# Patient Record
Sex: Male | Born: 1975 | Race: White | Hispanic: No | Marital: Married | Smoking: Current every day smoker
Health system: Southern US, Community
[De-identification: ages and names within clinical notes are randomized; demographics above are authoritative.]

## PROBLEM LIST (undated history)

## (undated) DIAGNOSIS — F329 Major depressive disorder, single episode, unspecified: Secondary | ICD-10-CM

## (undated) DIAGNOSIS — I219 Acute myocardial infarction, unspecified: Secondary | ICD-10-CM

## (undated) DIAGNOSIS — F319 Bipolar disorder, unspecified: Secondary | ICD-10-CM

## (undated) DIAGNOSIS — I10 Essential (primary) hypertension: Secondary | ICD-10-CM

## (undated) DIAGNOSIS — G43909 Migraine, unspecified, not intractable, without status migrainosus: Secondary | ICD-10-CM

## (undated) DIAGNOSIS — E785 Hyperlipidemia, unspecified: Secondary | ICD-10-CM

## (undated) DIAGNOSIS — F603 Borderline personality disorder: Secondary | ICD-10-CM

## (undated) DIAGNOSIS — R569 Unspecified convulsions: Secondary | ICD-10-CM

## (undated) DIAGNOSIS — F32A Depression, unspecified: Secondary | ICD-10-CM

## (undated) DIAGNOSIS — I2510 Atherosclerotic heart disease of native coronary artery without angina pectoris: Secondary | ICD-10-CM

## (undated) DIAGNOSIS — F419 Anxiety disorder, unspecified: Secondary | ICD-10-CM

## (undated) DIAGNOSIS — J45909 Unspecified asthma, uncomplicated: Secondary | ICD-10-CM

## (undated) HISTORY — DX: Migraine, unspecified, not intractable, without status migrainosus: G43.909

## (undated) HISTORY — DX: Acute myocardial infarction, unspecified: I21.9

## (undated) HISTORY — DX: Atherosclerotic heart disease of native coronary artery without angina pectoris: I25.10

## (undated) HISTORY — DX: Depression, unspecified: F32.A

## (undated) HISTORY — DX: Borderline personality disorder: F60.3

## (undated) HISTORY — DX: Major depressive disorder, single episode, unspecified: F32.9

## (undated) HISTORY — DX: Anxiety disorder, unspecified: F41.9

## (undated) HISTORY — DX: Essential (primary) hypertension: I10

## (undated) HISTORY — DX: Hyperlipidemia, unspecified: E78.5

## (undated) HISTORY — PX: CORONARY ANGIOPLASTY WITH STENT PLACEMENT: SHX49

## (undated) HISTORY — DX: Bipolar disorder, unspecified: F31.9

## (undated) HISTORY — DX: Unspecified asthma, uncomplicated: J45.909

---

## 2015-09-24 DIAGNOSIS — G2581 Restless legs syndrome: Secondary | ICD-10-CM | POA: Insufficient documentation

## 2016-01-31 DIAGNOSIS — F17200 Nicotine dependence, unspecified, uncomplicated: Secondary | ICD-10-CM | POA: Insufficient documentation

## 2016-01-31 DIAGNOSIS — M5136 Other intervertebral disc degeneration, lumbar region: Secondary | ICD-10-CM | POA: Insufficient documentation

## 2016-02-08 DIAGNOSIS — M5417 Radiculopathy, lumbosacral region: Secondary | ICD-10-CM | POA: Insufficient documentation

## 2016-02-08 DIAGNOSIS — G894 Chronic pain syndrome: Secondary | ICD-10-CM | POA: Insufficient documentation

## 2016-02-23 DIAGNOSIS — I2510 Atherosclerotic heart disease of native coronary artery without angina pectoris: Secondary | ICD-10-CM | POA: Insufficient documentation

## 2016-02-24 DIAGNOSIS — Z955 Presence of coronary angioplasty implant and graft: Secondary | ICD-10-CM | POA: Insufficient documentation

## 2016-07-01 DIAGNOSIS — R569 Unspecified convulsions: Secondary | ICD-10-CM

## 2016-07-01 HISTORY — DX: Unspecified convulsions: R56.9

## 2017-01-14 DIAGNOSIS — Z2821 Immunization not carried out because of patient refusal: Secondary | ICD-10-CM | POA: Insufficient documentation

## 2017-06-04 DIAGNOSIS — R451 Restlessness and agitation: Secondary | ICD-10-CM | POA: Insufficient documentation

## 2017-06-05 DIAGNOSIS — F447 Conversion disorder with mixed symptom presentation: Secondary | ICD-10-CM | POA: Insufficient documentation

## 2017-06-06 ENCOUNTER — Other Ambulatory Visit: Payer: Self-pay

## 2017-06-06 ENCOUNTER — Encounter: Payer: Self-pay | Admitting: Emergency Medicine

## 2017-06-06 ENCOUNTER — Emergency Department
Admission: EM | Admit: 2017-06-06 | Discharge: 2017-06-06 | Disposition: A | Discharge status: Home / Self Care | Payer: Self-pay | Attending: Emergency Medicine | Admitting: Emergency Medicine

## 2017-06-06 ENCOUNTER — Emergency Department: Payer: Self-pay

## 2017-06-06 DIAGNOSIS — F17200 Nicotine dependence, unspecified, uncomplicated: Secondary | ICD-10-CM | POA: Insufficient documentation

## 2017-06-06 DIAGNOSIS — R569 Unspecified convulsions: Secondary | ICD-10-CM | POA: Insufficient documentation

## 2017-06-06 DIAGNOSIS — R4789 Other speech disturbances: Secondary | ICD-10-CM | POA: Insufficient documentation

## 2017-06-06 DIAGNOSIS — I2510 Atherosclerotic heart disease of native coronary artery without angina pectoris: Secondary | ICD-10-CM | POA: Insufficient documentation

## 2017-06-06 DIAGNOSIS — E119 Type 2 diabetes mellitus without complications: Secondary | ICD-10-CM | POA: Insufficient documentation

## 2017-06-06 LAB — BASIC METABOLIC PANEL
ANION GAP: 9 (ref 5–15)
BUN: 10 mg/dL (ref 6–20)
CO2: 22 mmol/L (ref 22–32)
Calcium: 9 mg/dL (ref 8.9–10.3)
Chloride: 110 mmol/L (ref 101–111)
Creatinine, Ser: 0.93 mg/dL (ref 0.61–1.24)
GFR calc Af Amer: >60 mL/min (ref >60)
GFR calc non Af Amer: >60 mL/min (ref >60)
GLUCOSE: 104 mg/dL — AB (ref 65–99)
POTASSIUM: 4.1 mmol/L (ref 3.5–5.1)
Sodium: 141 mmol/L (ref 135–145)

## 2017-06-06 LAB — CBC
HEMATOCRIT: 41.5 % (ref 40.0–52.0)
HEMOGLOBIN: 14.2 g/dL (ref 13.0–18.0)
MCH: 30.9 pg (ref 26.0–34.0)
MCHC: 34.3 g/dL (ref 32.0–36.0)
MCV: 90.2 fL (ref 80.0–100.0)
Platelets: 147 10*3/uL — ABNORMAL LOW (ref 150–440)
RBC: 4.6 MIL/uL (ref 4.40–5.90)
RDW: 12.5 % (ref 11.5–14.5)
WBC: 7.4 10*3/uL (ref 3.8–10.6)

## 2017-06-06 MED ORDER — ALBUTEROL SULFATE (2.5 MG/3ML) 0.083% IN NEBU
2.50 | INHALATION_SOLUTION | RESPIRATORY_TRACT | Status: DC
Start: ? — End: 2017-06-06

## 2017-06-06 MED ORDER — GABAPENTIN 300 MG PO CAPS
600.00 | ORAL_CAPSULE | ORAL | Status: DC
Start: 2017-06-06 — End: 2017-06-06

## 2017-06-06 MED ORDER — SODIUM CHLORIDE 0.9 % IV SOLN
INTRAVENOUS | Status: DC
Start: ? — End: 2017-06-06

## 2017-06-06 MED ORDER — IBUPROFEN 600 MG PO TABS
600.00 | ORAL_TABLET | ORAL | Status: DC
Start: ? — End: 2017-06-06

## 2017-06-06 MED ORDER — LORAZEPAM 2 MG/ML IJ SOLN
2.00 | INTRAMUSCULAR | Status: DC
Start: ? — End: 2017-06-06

## 2017-06-06 MED ORDER — GENERIC EXTERNAL MEDICATION
Status: DC
Start: ? — End: 2017-06-06

## 2017-06-06 MED ORDER — PANTOPRAZOLE SODIUM 20 MG PO TBEC
20.00 | DELAYED_RELEASE_TABLET | ORAL | Status: DC
Start: 2017-06-07 — End: 2017-06-06

## 2017-06-06 MED ORDER — AMLODIPINE BESYLATE 5 MG PO TABS
5.00 | ORAL_TABLET | ORAL | Status: DC
Start: 2017-06-07 — End: 2017-06-06

## 2017-06-06 MED ORDER — CLOPIDOGREL BISULFATE 75 MG PO TABS
75.00 | ORAL_TABLET | ORAL | Status: DC
Start: 2017-06-07 — End: 2017-06-06

## 2017-06-06 MED ORDER — METOPROLOL SUCCINATE ER 25 MG PO TB24
25.00 | ORAL_TABLET | ORAL | Status: DC
Start: 2017-06-07 — End: 2017-06-06

## 2017-06-06 MED ORDER — ASPIRIN 325 MG PO TABS
325.00 | ORAL_TABLET | ORAL | Status: DC
Start: 2017-06-07 — End: 2017-06-06

## 2017-06-06 MED ORDER — ATORVASTATIN CALCIUM 40 MG PO TABS
40.00 | ORAL_TABLET | ORAL | Status: DC
Start: 2017-06-07 — End: 2017-06-06

## 2017-06-06 NOTE — Discharge Instructions (Signed)
Return to the emergency department for any new or worrisome symptoms occluding change in your seizure-like activity, vomiting, diarrhea, fever, severe pain or other complaints.  Do not soak in the tub, climb ladders, or drive until cleared by neurology.  Follow closely with [facility] neurology.

## 2017-06-06 NOTE — ED Triage Notes (Addendum)
Patient presents to ED with difficulty speaking. Patient has very delayed responses. When patient does speak he has stuttering speech. Patient in hospital gown and hospital socks. Patient left AMA from [place]. Wife reports patient had two big seizures there but "they didn't believe it because it didn't show up on a machine. They didn't even clean him up afterwards". Patient is lethargic but will follow commands after repetitive cues.

## 2017-06-06 NOTE — ED Notes (Addendum)
Pt family member noted that they left [facility] AMA because they didn't like the care the hospital was giving the pt. Family states that he had two witnessed seizures yesterday. Family at the bedside. Pt has Hx of heart attack Aug 2017.

## 2017-06-06 NOTE — ED Notes (Signed)
First Nurse Note:  Patient presents to the ED after leaving [facility].  Per patient's daughter patient has been having 2 seizures per day for the past several days.  Family was uncomfortable with Brad Randolph because they state patient had a seizure yesterday evening and "turned purple and they didn't care or were doing anything."

## 2017-06-06 NOTE — ED Notes (Signed)
Spoke with Dr. Joni Fears in regards to patient presentation. See orders.

## 2017-06-06 NOTE — ED Provider Notes (Addendum)
[facility] Emergency Department Provider Note  ____________________________________________   I have reviewed the triage vital signs and the nursing notes. Where available I have reviewed prior notes and, if possible and indicated, outside hospital notes.    HISTORY  Chief Complaint Seizures    HPI Brad Randolph is a 41 y.o. male who presents today complaining of 2-3 months worth of slow speech and occasional twitching behavior and anxiety and stress, patient has had extensive workup for this including 2- MRIs, and a negative EEG 2 days ago.  He was told that this is likely psychogenic and he and family left AGAINST MEDICAL ADVICE from the facility at which they were.  They actually had an outpatient follow-up with neurology today, they have already seen different neurologist but have not been happy with the answers they have gotten, and they have a follow-up appointment with neurology at [facility] today but were unable to make it because they were admitted at outside hospital.  All of those records are available and I have reviewed them.  Patient states that because he missed his appointment he is unable to get to [facility] until the 21st and he is worried what he is going to do until then because his speech is slow and he has several twitching episodes a day.  According to the patient and family has been having twitching episodes only really while awake since early October without any intermission despite extensive outpatient and inpatient workups for the same.  They want to know if there is anything else the ER at [facility] can provide   Past Medical History:  Diagnosis Date  . Diabetes mellitus without complication ([place])     There are no active problems to display for this patient.   Past Surgical History:  Procedure Laterality Date  . CORONARY ANGIOPLASTY WITH STENT PLACEMENT      Prior to Admission medications   Not on File    Allergies Trazodone and  nefazodone  No family history on file.  Social History Social History   Tobacco Use  . Smoking status: Current Every Day Smoker  . Smokeless tobacco: Never Used  Substance Use Topics  . Alcohol use: No    Frequency: Never  . Drug use: No    Review of Systems Constitutional: No fever/chills Eyes: No visual changes. ENT: No sore throat. No stiff neck no neck pain Cardiovascular: Denies chest pain. Respiratory: Denies shortness of breath. Gastrointestinal:   no vomiting.  No diarrhea.  No constipation. Genitourinary: Negative for dysuria. Musculoskeletal: Negative lower extremity swelling Skin: Negative for rash. Neurological: History of present illness   ____________________________________________   PHYSICAL EXAM:  VITAL SIGNS: ED Triage Vitals  Enc Vitals Group     BP 06/06/17 1702 124/84     Pulse Rate 06/06/17 1702 96     Resp 06/06/17 1702 16     Temp 06/06/17 1702 98.4 F (36.9 C)     Temp Source 06/06/17 1702 Oral     SpO2 06/06/17 1702 99 %     Weight 06/06/17 1702 200 lb (90.7 kg)     Height 06/06/17 1702 5' 9"  (1.753 m)     Head Circumference --      Peak Flow --      Pain Score 06/06/17 1916 10     Pain Loc --      Pain Edu? --      Excl. in [place]? --     Constitutional: Alert and oriented. Well appearing  and in no acute distress.  Skin speaks in a primitive slow voice but is coherent Eyes: Conjunctivae are normal Head: Atraumatic HEENT: No congestion/rhinnorhea. Mucous membranes are moist.  Oropharynx non-erythematous Neck:   Nontender with no meningismus, no masses, no stridor Cardiovascular: Normal rate, regular rhythm. Grossly normal heart sounds.  Good peripheral circulation. Respiratory: Normal respiratory effort.  No retractions. Lungs CTAB. Abdominal: Soft and nontender. No distention. No guarding no rebound Back:  There is no focal tenderness or step off.  there is no midline tenderness there are no lesions noted. there is no CVA  tenderness Musculoskeletal: No lower extremity tenderness, no upper extremity tenderness. No joint effusions, no DVT signs strong distal pulses no edema Neurologic: Patient is normal in understanding and in ability to make words however, he has very slow deliberate speech with very simple grammatical primitive structures noted.  "I no know why" he has limited interaction with my neurologic exam but there is no obvious focal neurologic deficit.  When he holds both hands up there is no drift, no obvious weakness noted in the lower extremities but he gets easily distracted and has to be coached multiple times to participate in the exam.  He is able to use his cell phone with no difficulty including fine typing. Skin:  Skin is warm, dry and intact. No rash noted. Psychiatric: Mood and affect are normal. Speech and behavior are normal.  ____________________________________________   LABS (all labs ordered are listed, but only abnormal results are displayed)  Labs Reviewed  BASIC METABOLIC PANEL - Abnormal; Notable for the following components:      Result Value   Glucose, Bld 104 (*)    All other components within normal limits  CBC - Abnormal; Notable for the following components:   Platelets 147 (*)    All other components within normal limits  CBG MONITORING, ED    Pertinent labs  results that were available during my care of the patient were reviewed by me and considered in my medical decision making (see chart for details). ____________________________________________  EKG  I personally interpreted any EKGs ordered by me or triage  ____________________________________________  RADIOLOGY  Pertinent labs & imaging results that were available during my care of the patient were reviewed by me and considered in my medical decision making (see chart for details). If possible, patient and/or family made aware of any abnormal findings.  Ct Head Wo Contrast  Result Date:  06/06/2017 CLINICAL DATA:  Difficulty speaking. EXAM: CT HEAD WITHOUT CONTRAST TECHNIQUE: Contiguous axial images were obtained from the base of the skull through the vertex without intravenous contrast. COMPARISON:  None. FINDINGS: Brain: No evidence of acute infarction, hemorrhage, hydrocephalus, extra-axial collection or mass lesion/mass effect. Vascular: No hyperdense vessel or unexpected calcification. Skull: Normal. Negative for fracture or focal lesion. Sinuses/Orbits: Mild mucosal thickening in the bilateral maxillary sinuses and ethmoid air cells. The mastoid air cells are clear. The orbits are unremarkable. Other: None. IMPRESSION: 1.  No acute intracranial abnormality. 2. Mild paranasal sinus disease. Electronically Signed   By: Titus Dubin M.D.   On: 06/06/2017 17:47   ____________________________________________    PROCEDURES  Procedure(s) performed: None  Procedures  Critical Care performed: None  ____________________________________________   INITIAL IMPRESSION / ASSESSMENT AND PLAN / ED COURSE  Pertinent labs & imaging results that were available during my care of the patient were reviewed by me and considered in my medical decision making (see chart for details).  She has been having seizure-like  activity for several months has seen multiple neurologists had 2- MRIs negative EEG and negative blood work, CT scan here is negative, unclear exactly what the etiology of this patient's symptom is.  It is thought to be psychogenic by the prior hospital, patient has no SI or HI, there is certainly nothing we are going to offer at [facility] that is going to transcend what he is already had an workup during this exact same hospital admission which ended abruptly today when he was diagnosed with pseudo-seizure and psychogenic neurologic behavior.  Nonetheless, I did call [facility] neurology as patient is scheduled to follow-up with them on the 21st.  Dr. Arna Snipe was kind  enough to take the call I made him aware of findings and the patient's records, he had no further suggestions and he will continue to see the patient as an outpatient.  There was no suggestion of epileptic medication I think that is not unreasonable given negative MRI and very atypical chronic symptoms.  We have advised the patient not to drive, soak in the tub, climb ladders and return precautions and follow-up been given and understood.  ----------------------------------------- 9:04 PM on 06/06/2017 -----------------------------------------  CT scan of the abdomen and pelvis was performed, there was no evidence at this time pathology noted which is reassuring.  This test was not absolutely indicated for the patient and I have explained to him that it was, in fact, advertently performed on him during a period of very high acuity in the department.  Patient voiced understanding of this, as well as the minor risk of radiation exposure.  He is by himself in the room.  he does understand what happened.  I have also spoken to ER management and indicated the need for not billing the patient for this procedure.    ____________________________________________   FINAL CLINICAL IMPRESSION(S) / ED DIAGNOSES  Final diagnoses:  None      This chart was dictated using voice recognition software.  Despite best efforts to proofread,  errors can occur which can change meaning.      Schuyler Amor, MD 06/06/17 2011    Schuyler Amor, MD 06/06/17 2122

## 2018-01-22 ENCOUNTER — Encounter (HOSPITAL_COMMUNITY): Payer: Self-pay

## 2018-01-22 ENCOUNTER — Other Ambulatory Visit: Payer: Self-pay

## 2018-01-22 ENCOUNTER — Emergency Department (HOSPITAL_COMMUNITY)
Admission: EM | Admit: 2018-01-22 | Discharge: 2018-01-22 | Disposition: A | Discharge status: Home / Self Care | Payer: Self-pay | Attending: Emergency Medicine | Admitting: Emergency Medicine

## 2018-01-22 DIAGNOSIS — F17200 Nicotine dependence, unspecified, uncomplicated: Secondary | ICD-10-CM | POA: Insufficient documentation

## 2018-01-22 DIAGNOSIS — R569 Unspecified convulsions: Secondary | ICD-10-CM | POA: Insufficient documentation

## 2018-01-22 DIAGNOSIS — E119 Type 2 diabetes mellitus without complications: Secondary | ICD-10-CM | POA: Insufficient documentation

## 2018-01-22 HISTORY — DX: Unspecified convulsions: R56.9

## 2018-01-22 LAB — CBC WITH DIFFERENTIAL/PLATELET
BASOS ABS: 0 10*3/uL (ref 0.0–0.1)
BASOS PCT: 0 %
Eosinophils Absolute: 0.1 10*3/uL (ref 0.0–0.7)
Eosinophils Relative: 1 %
HEMATOCRIT: 41.7 % (ref 39.0–52.0)
Hemoglobin: 14 g/dL (ref 13.0–17.0)
Lymphocytes Relative: 23 %
Lymphs Abs: 2.5 10*3/uL (ref 0.7–4.0)
MCH: 30.9 pg (ref 26.0–34.0)
MCHC: 33.6 g/dL (ref 30.0–36.0)
MCV: 92.1 fL (ref 78.0–100.0)
MONO ABS: 0.9 10*3/uL (ref 0.1–1.0)
Monocytes Relative: 9 %
NEUTROS ABS: 7.3 10*3/uL (ref 1.7–7.7)
Neutrophils Relative %: 67 %
Platelets: 190 10*3/uL (ref 150–400)
RBC: 4.53 MIL/uL (ref 4.22–5.81)
RDW: 12.2 % (ref 11.5–15.5)
WBC: 10.8 10*3/uL — AB (ref 4.0–10.5)

## 2018-01-22 LAB — BASIC METABOLIC PANEL
ANION GAP: 8 (ref 5–15)
BUN: 12 mg/dL (ref 6–20)
CO2: 26 mmol/L (ref 22–32)
Calcium: 9.2 mg/dL (ref 8.9–10.3)
Chloride: 106 mmol/L (ref 98–111)
Creatinine, Ser: 0.94 mg/dL (ref 0.61–1.24)
Glucose, Bld: 121 mg/dL — ABNORMAL HIGH (ref 70–99)
POTASSIUM: 3.7 mmol/L (ref 3.5–5.1)
Sodium: 140 mmol/L (ref 135–145)

## 2018-01-22 MED ORDER — SODIUM CHLORIDE 0.9 % IV BOLUS
1000.0000 mL | Freq: Once | INTRAVENOUS | Status: AC
  Administered 2018-01-22: 1000 mL via INTRAVENOUS

## 2018-01-22 MED ORDER — LEVETIRACETAM 250 MG PO TABS: 250.0000 mg | ORAL_TABLET | Freq: Two times a day (BID) | ORAL | 0 refills | Status: DC

## 2018-01-22 MED ORDER — LEVETIRACETAM IN NACL 1500 MG/100ML IV SOLN
1500.0000 mg | Freq: Once | INTRAVENOUS | Status: AC
  Administered 2018-01-22: 1500 mg via INTRAVENOUS
  Filled 2018-01-22: qty 100

## 2018-01-22 NOTE — ED Triage Notes (Signed)
[name] states he had seizure at a doctors appointment today. States he started shaking, had trouble speaking as well. Pt has a history of seizures and has not had his Keppra in "a while" due to not having insurance. Pt's speech is slighly off, but per sister this is normal speech for him

## 2018-01-22 NOTE — ED Notes (Signed)
Seizure pads placed by [employer]

## 2018-01-22 NOTE — ED Provider Notes (Signed)
Emergency Department Provider Note   I have reviewed the triage vital signs and the nursing notes.   HISTORY  Chief Complaint Seizures   HPI Brad Randolph is a 42 y.o. male with PMH of seizure, DM, and known speech disturbance and left sided weakness resents to the emergency department for evaluation of seizure.  The patient sister states that patient had a seizure while at [place] office today.  This seemed typical of his seizure and lasted less than 2 minutes.  He did not stop breathing.  She notes that his slow speech and left-sided weakness is baseline for him.  Patient has apparently been trying to stretch his Keppra prescription by taking 250 mg once daily as opposed to twice.  The patient's sister suspects that he may even be taking it every other day.  She tells me that the primary care physician has refused to fill the prescription and insisted that he see a neurologist but he cannot because of insurance lapse.  She denies any fevers or chills.  He has not been sleeping well but has been staying hydrated.  He denies any drug or alcohol use. No mouth/tongue pain.   Past Medical History:  Diagnosis Date  . Diabetes mellitus without complication ([place])   . Seizures ([place])     There are no active problems to display for this patient.   Past Surgical History:  Procedure Laterality Date  . CORONARY ANGIOPLASTY WITH STENT PLACEMENT     Allergies Trazodone and nefazodone  No family history on file.  Social History Social History   Tobacco Use  . Smoking status: Current Every Day Smoker  . Smokeless tobacco: Never Used  Substance Use Topics  . Alcohol use: No    Frequency: Never  . Drug use: No    Review of Systems  Constitutional: No fever/chills Eyes: No visual changes. ENT: No sore throat. Cardiovascular: Denies chest pain. Respiratory: Denies shortness of breath. Gastrointestinal: No abdominal pain.  No nausea, no vomiting.  No diarrhea.  No  constipation. Genitourinary: Negative for dysuria. Musculoskeletal: Negative for back pain. Skin: Negative for rash. Neurological: Negative for headaches, focal weakness or numbness. Positive breakthrough seizure.   10-point ROS otherwise negative.  ____________________________________________   PHYSICAL EXAM:  VITAL SIGNS: ED Triage Vitals  Enc Vitals Group     BP 01/22/18 1532 137/72     Pulse Rate 01/22/18 1532 99     Resp 01/22/18 1532 14     Temp 01/22/18 1532 98.7 F (37.1 C)     Temp Source 01/22/18 1532 Oral     SpO2 01/22/18 1532 100 %     Weight 01/22/18 1533 195 lb (88.5 kg)     Height 01/22/18 1533 5' 9"  (1.753 m)     Pain Score 01/22/18 1533 8   Constitutional: Alert and oriented. Well appearing and in no acute distress. Speech is very slow.  Eyes: Conjunctivae are normal. PERRL. Head: Atraumatic. Nose: No congestion/rhinnorhea. Mouth/Throat: Mucous membranes are moist.   Neck: No stridor.   Cardiovascular: Normal rate, regular rhythm. Good peripheral circulation. Grossly normal heart sounds.   Respiratory: Normal respiratory effort.  No retractions. Lungs CTAB. Gastrointestinal: Soft and nontender. No distention.  Musculoskeletal: No lower extremity tenderness nor edema. No gross deformities of extremities. Neurologic:  Positive slow speech and language. 4+/5 LUE and LLE. Normal CN exam.  Skin:  Skin is warm, dry and intact. No rash noted.  ____________________________________________   LABS (all labs ordered are listed, but only  abnormal results are displayed)  Labs Reviewed  CBC WITH DIFFERENTIAL/PLATELET - Abnormal; Notable for the following components:      Result Value   WBC 10.8 (*)    All other components within normal limits  BASIC METABOLIC PANEL - Abnormal; Notable for the following components:   Glucose, Bld 121 (*)    All other components within normal limits   ____________________________________________  EKG   EKG  Interpretation  Date/Time:  Thursday January 22 2018 16:45:43 EDT Ventricular Rate:  75 PR Interval:    QRS Duration: 92 QT Interval:  358 QTC Calculation: 400 R Axis:   92 Text Interpretation:  Sinus rhythm Borderline right axis deviation Baseline wander in lead(s) II III aVR aVF no stemi Confirmed by Nanda Quinton [phone]) on 01/22/2018 4:51:11 PM       ____________________________________________  RADIOLOGY  None ____________________________________________   PROCEDURES  Procedure(s) performed:   Procedures  None ____________________________________________   INITIAL IMPRESSION / ASSESSMENT AND PLAN / ED COURSE  Pertinent labs & imaging results that were available during my care of the patient were reviewed by me and considered in my medical decision making (see chart for details).  Patient presents to the emergency department for evaluation of breakthrough seizure.  He has been stretching his Keppra which is the provoking factor.  He has baseline neuro deficits mentioned above including speech and left-sided weakness.  No indication for head imaging at this time.  Plan for Keppra load, IV fluids and discharge with plan for neurology follow-up as able.  Labs reviewed with no acute findings. EKG with no acute findings. Loaded with Keppra here and refilled keppra at current dose. Advised to take as directed. Patient given follow up information for a local Neurologist to call tomorrow. Patient does not drive.   At this time, I do not feel there is any life-threatening condition present. I have reviewed and discussed all results (EKG, imaging, lab, urine as appropriate), exam findings with patient. I have reviewed nursing notes and appropriate previous records.  I feel the patient is safe to be discharged home without further emergent workup. Discussed usual and customary return precautions. Patient and family (if present) verbalize understanding and are comfortable with this plan.   Patient will follow-up with their primary care provider. If they do not have a primary care provider, information for follow-up has been provided to them. All questions have been answered.  ____________________________________________  FINAL CLINICAL IMPRESSION(S) / ED DIAGNOSES  Final diagnoses:  Seizure ([place])     MEDICATIONS GIVEN DURING THIS VISIT:  Medications  sodium chloride 0.9 % bolus 1,000 mL (1,000 mLs Intravenous New Bag/Given 01/22/18 1606)  levETIRAcetam (KEPPRA) IVPB 1500 mg/ 100 mL premix (1,500 mg Intravenous New Bag/Given 01/22/18 1606)     NEW OUTPATIENT MEDICATIONS STARTED DURING THIS VISIT:  New Prescriptions   LEVETIRACETAM (KEPPRA) 250 MG TABLET    Take 1 tablet (250 mg total) by mouth 2 (two) times daily.    Note:  This document was prepared using Dragon voice recognition software and may include unintentional dictation errors.  Nanda Quinton, MD Emergency Medicine    Long, [facility], MD 01/22/18 [phone]

## 2018-01-22 NOTE — Discharge Instructions (Signed)
You have been seen in the emergency department today for a seizure.  Your workup today including labs are within normal limits.  Please follow up with your doctor/neurologist as soon as possible regarding today's emergency department visit and your likely seizure.  As we have discussed it is very important that you do not drive until you have been seen and cleared by your neurologist.  Please drink plenty of fluids, get plenty of sleep and avoid any alcohol or drug use Please return to the emergency department if you have any further seizures which do not respond to medications, or for any other symptoms per se concerning for yourself.

## 2018-02-18 ENCOUNTER — Encounter: Payer: Self-pay | Admitting: Physician Assistant

## 2018-02-18 ENCOUNTER — Ambulatory Visit: Payer: Self-pay | Admitting: Physician Assistant

## 2018-02-18 VITALS — BP 90/68 | HR 91 | Temp 97.9°F | Ht 66.75 in | Wt 191.0 lb

## 2018-02-18 DIAGNOSIS — R569 Unspecified convulsions: Secondary | ICD-10-CM

## 2018-02-18 DIAGNOSIS — I671 Cerebral aneurysm, nonruptured: Secondary | ICD-10-CM

## 2018-02-18 DIAGNOSIS — R51 Headache: Secondary | ICD-10-CM

## 2018-02-18 DIAGNOSIS — G8929 Other chronic pain: Secondary | ICD-10-CM

## 2018-02-18 DIAGNOSIS — I252 Old myocardial infarction: Secondary | ICD-10-CM

## 2018-02-18 DIAGNOSIS — I2510 Atherosclerotic heart disease of native coronary artery without angina pectoris: Secondary | ICD-10-CM

## 2018-02-18 DIAGNOSIS — Z7689 Persons encountering health services in other specified circumstances: Secondary | ICD-10-CM

## 2018-02-18 MED ORDER — LEVETIRACETAM 250 MG PO TABS: 250.0000 mg | ORAL_TABLET | Freq: Two times a day (BID) | ORAL | 0 refills | Status: DC
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
## 2018-03-03 ENCOUNTER — Encounter: Payer: Self-pay | Admitting: Student

## 2018-03-03 NOTE — Telephone Encounter (Signed)
Erroneous Encounter

## 2018-03-16 ENCOUNTER — Encounter: Payer: Self-pay | Admitting: Physician Assistant

## 2018-03-18 ENCOUNTER — Ambulatory Visit: Payer: Self-pay | Admitting: Physician Assistant

## 2018-03-18 ENCOUNTER — Encounter: Payer: Self-pay | Admitting: Physician Assistant

## 2018-03-18 VITALS — BP 137/79 | HR 82 | Temp 97.9°F | Ht 66.75 in | Wt 195.5 lb

## 2018-03-18 DIAGNOSIS — R51 Headache: Secondary | ICD-10-CM

## 2018-03-18 DIAGNOSIS — I10 Essential (primary) hypertension: Secondary | ICD-10-CM | POA: Insufficient documentation

## 2018-03-18 DIAGNOSIS — R079 Chest pain, unspecified: Secondary | ICD-10-CM

## 2018-03-18 DIAGNOSIS — R569 Unspecified convulsions: Secondary | ICD-10-CM

## 2018-03-18 DIAGNOSIS — I252 Old myocardial infarction: Secondary | ICD-10-CM

## 2018-03-18 DIAGNOSIS — G8929 Other chronic pain: Secondary | ICD-10-CM

## 2018-03-18 DIAGNOSIS — I671 Cerebral aneurysm, nonruptured: Secondary | ICD-10-CM

## 2018-03-18 DIAGNOSIS — I2510 Atherosclerotic heart disease of native coronary artery without angina pectoris: Secondary | ICD-10-CM | POA: Insufficient documentation

## 2018-03-18 DIAGNOSIS — Z9861 Coronary angioplasty status: Secondary | ICD-10-CM

## 2018-03-18 MED ORDER — AMLODIPINE BESYLATE 5 MG PO TABS: 5.0000 mg | ORAL_TABLET | Freq: Every day | ORAL | 3 refills | Status: DC

## 2018-03-18 MED ORDER — NITROGLYCERIN 0.4 MG SL SUBL: 0.4000 mg | SUBLINGUAL_TABLET | SUBLINGUAL | 1 refills | Status: DC | PRN

## 2018-03-18 MED ORDER — LEVETIRACETAM 250 MG PO TABS: 250.0000 mg | ORAL_TABLET | Freq: Two times a day (BID) | ORAL | 0 refills | Status: DC

## 2018-03-18 NOTE — Progress Notes (Signed)
BP 137/79 (BP Location: Right Arm, Patient Position: Sitting, Cuff Size: Normal)   Pulse 82   Temp 97.9 F (36.6 C)   Ht 5' 6.75" (1.695 m)   Wt 195 lb 8 oz (88.7 kg)   SpO2 99%   BMI 30.85 kg/m    Subjective:    Patient ID: Brad Randolph, male    DOB: 1976-01-01, 42 y.o.   MRN: [id number]  HPI: Brad Randolph is a 42 y.o. male presenting on 03/18/2018 for Follow-up   HPI   Labwork done 12/30/17 scanned under labs tab- cbc, cmp, tsh, lipids.  Pt is still going to [facility] facility in [place] [facility]  He has appointment with cardiology 04/09/18 and neurology 04/17/18  Pt has applied for medicaid and is waiting to hear-    BP up today- pt states his back is hurting.  He says it has been ongoing for years just some days it hurts more than others.  BP at OV 02/18/18 was 90/68.    He is having episodes of CP that lasts only a few minutes at a time but then it comes back.  He sometimes get SOB with the CP.  No nausea.  Sometimes he gets a little bit diaphoretic.   Pt with history MI.  He says drugs were not contributory to his MI but he was using energy drinks at the time.  He says he has CAD.   Pt says he feels anxiety when he is having the CP but not prior to the onset of the pain.   Records received from [facility] include imaging done on 09/10/17- CT head and CXR.  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  [name] is with him today who helps with history.  Relevant past medical, surgical, family and social history reviewed and updated as indicated. Interim medical history since our last visit reviewed. Allergies and medications reviewed and updated.   Current Outpatient Medications:  .  amLODipine (NORVASC) 5 MG tablet, Take 5 mg by mouth daily., Disp: , Rfl:  .  aspirin EC 325 MG tablet, Take 1 tablet by mouth daily., Disp: , Rfl: 3 .  atorvastatin (LIPITOR) 80 MG tablet, Take 40 mg by  mouth daily., Disp: , Rfl:  .  benztropine (COGENTIN) 1 MG tablet, Take 1 mg by mouth 2 (two) times daily., Disp: , Rfl:  .  clopidogrel (PLAVIX) 75 MG tablet, Take 1 tablet by mouth daily., Disp: , Rfl: 4 .  levETIRAcetam (KEPPRA) 250 MG tablet, Take 1 tablet (250 mg total) by mouth 2 (two) times daily., Disp: 60 tablet, Rfl: 0 .  metoprolol succinate (TOPROL-XL) 25 MG 24 hr tablet, Take 12.5 mg by mouth daily., Disp: , Rfl: 2 .  Paliperidone Palmitate ER (INVEGA SUSTENNA) 117 MG/0.75ML SUSY, Inject into the muscle every 30 (thirty) days., Disp: , Rfl:  .  topiramate (TOPAMAX) 25 MG tablet, Take 25 mg by mouth 2 (two) times daily as needed (Headache)., Disp: , Rfl:  .  risperiDONE (RISPERDAL) 3 MG tablet, Take 3 mg by mouth 2 (two) times daily., Disp: , Rfl:    Review of Systems  Constitutional: Positive for fatigue and unexpected weight change. Negative for appetite change, chills, diaphoresis and fever.  HENT: Positive for dental problem, drooling and facial swelling. Negative for congestion, ear pain, hearing loss, mouth sores, sneezing, sore throat, trouble swallowing and voice change.   Eyes: Positive for  pain and visual disturbance. Negative for discharge, redness and itching.  Respiratory: Positive for choking. Negative for cough, shortness of breath and wheezing.   Cardiovascular: Positive for chest pain and leg swelling. Negative for palpitations.  Gastrointestinal: Negative for abdominal pain, blood in stool, constipation, diarrhea and vomiting.  Endocrine: Positive for polydipsia. Negative for cold intolerance and heat intolerance.  Genitourinary: Negative for decreased urine volume, dysuria and hematuria.  Musculoskeletal: Positive for arthralgias, back pain and gait problem.  Skin: Negative for rash.  Allergic/Immunologic: Negative for environmental allergies.  Neurological: Positive for seizures, syncope, light-headedness and headaches.  Hematological: Negative for adenopathy.   Psychiatric/Behavioral: Positive for agitation, dysphoric mood and suicidal ideas. The patient is nervous/anxious.     Per HPI unless specifically indicated above     Objective:    BP 137/79 (BP Location: Right Arm, Patient Position: Sitting, Cuff Size: Normal)   Pulse 82   Temp 97.9 F (36.6 C)   Ht 5' 6.75" (1.695 m)   Wt 195 lb 8 oz (88.7 kg)   SpO2 99%   BMI 30.85 kg/m   Wt Readings from Last 3 Encounters:  03/18/18 195 lb 8 oz (88.7 kg)  02/18/18 191 lb (86.6 kg)  01/22/18 195 lb (88.5 kg)    Physical Exam  Constitutional: He is oriented to person, place, and time. He appears well-developed and well-nourished.  HENT:  Head: Normocephalic and atraumatic.  Neck: Neck supple.  Cardiovascular: Normal rate and regular rhythm.  Pulmonary/Chest: Effort normal and breath sounds normal. He has no wheezes.  Abdominal: Soft. Bowel sounds are normal. There is no hepatosplenomegaly. There is no tenderness.  Musculoskeletal: He exhibits no edema.  Lymphadenopathy:    He has no cervical adenopathy.  Neurological: He is alert and oriented to person, place, and time.  Skin: Skin is warm and dry.  Psychiatric: He has a normal mood and affect. His behavior is normal.  Vitals reviewed.  EKG- NSR with no ST-T changes.  No changes compared with EKG done 01/23/18     Assessment & Plan:    Encounter Diagnoses  Name Primary?  . Coronary artery disease involving native heart without angina pectoris, unspecified vessel or lesion type Yes  . Essential hypertension   . Intracranial aneurysm   . Chest pain, unspecified type   . History of MI (myocardial infarction)   . Seizure-like activity ([place])   . Chronic nonintractable headache, unspecified headache type     -pt is given rx nitrostat and counseled on its proper use including to go to ER for CP that does not resolve after 3 doses  -pt to continue other medications -pt to go to cardiology and neurology appointments as  scheduled -pt to continue with [facility] for mental health issues -pt to follow up here 6 weeks.  RTO sooner prn

## 2018-04-09 ENCOUNTER — Encounter: Payer: Self-pay | Admitting: Cardiology

## 2018-04-09 ENCOUNTER — Ambulatory Visit (INDEPENDENT_AMBULATORY_CARE_PROVIDER_SITE_OTHER): Payer: Self-pay | Admitting: Cardiology

## 2018-04-09 VITALS — BP 116/76 | HR 77 | Ht 69.0 in | Wt 200.0 lb

## 2018-04-09 DIAGNOSIS — R079 Chest pain, unspecified: Secondary | ICD-10-CM

## 2018-04-09 DIAGNOSIS — I2510 Atherosclerotic heart disease of native coronary artery without angina pectoris: Secondary | ICD-10-CM

## 2018-04-09 MED ORDER — CLOPIDOGREL BISULFATE 75 MG PO TABS: 75.0000 mg | ORAL_TABLET | Freq: Every day | ORAL | 3 refills | Status: DC

## 2018-04-09 MED ORDER — ATORVASTATIN CALCIUM 80 MG PO TABS: 40.0000 mg | ORAL_TABLET | Freq: Every day | ORAL | 3 refills | Status: DC

## 2018-04-09 MED ORDER — AMLODIPINE BESYLATE 5 MG PO TABS: 5.0000 mg | ORAL_TABLET | Freq: Every day | ORAL | 3 refills | Status: DC

## 2018-04-09 MED ORDER — METOPROLOL SUCCINATE ER 25 MG PO TB24: 12.5000 mg | ORAL_TABLET | Freq: Every day | ORAL | 3 refills | Status: DC

## 2018-04-09 MED ORDER — ASPIRIN EC 325 MG PO TBEC: 325.0000 mg | DELAYED_RELEASE_TABLET | Freq: Every day | ORAL | 3 refills | Status: DC

## 2018-04-09 NOTE — Patient Instructions (Signed)
Medication Instructions:  Your physician recommends that you continue on your current medications as directed. Please refer to the Current Medication list given to you today.   Labwork: none  Testing/Procedures: Your physician has requested that you have a lexiscan myoview. For further information please visit [email or web address]. Please follow instruction sheet, as given.    Follow-Up: Your physician recommends that you schedule a follow-up appointment in: 6 weeks    Any Other Special Instructions Will Be Listed Below (If Applicable).     If you need a refill on your cardiac medications before your next appointment, please call your pharmacy.

## 2018-04-09 NOTE — Progress Notes (Signed)
Clinical Summary Brad Randolph is a 42 y.o.male seen as new consult, referred by [facility] for history of CAD and chest pain.    1. Chest pain -12/2015 admitted to [facility] with NSTEMI, received DES x 2 to RCA  - 08/2017 echo LVEF 55-60% - 03/2017 nuclear stress test [facility]  - has had recent chest pain. Ongoing x 1 month. Pressure midchest, can occur at rest or with activity. Significant pain, some increased SOB. Unable to describe duration. Better with deep breathing. Not related to food. Occurs about once a week, last episode was exertional - occasional SOB with activities. Pain is better with nitroglycerin.  - always with some angina he reports.  - currently on ASA and clopidogrel, I think due to possible CVA but unclear.    2. Neurological disorder - extensive prior workup, appears last by [facility] without a clear diagnosis. Notes mention some consideration for covernsion disorder.  Past Medical History:  Diagnosis Date  . Anxiety   . Asthma    chilldhood  . Bipolar disorder ([place])   . Borderline personality disorder ([place])   . Depression   . Heart attack ([place])   . Hyperlipidemia   . Hypertension   . Seizures ([facility])      Allergies  Allergen Reactions  . Mushroom Extract Complex Diarrhea and Nausea And Vomiting  . Other Hives    Dial Soap. - breaks out and blisters  . Trazodone And Nefazodone Other (See Comments)    Violent     Current Outpatient Medications  Medication Sig Dispense Refill  . amLODipine (NORVASC) 5 MG tablet Take 1 tablet (5 mg total) by mouth daily. 30 tablet 3  . aspirin EC 325 MG tablet Take 1 tablet by mouth daily.  3  . atorvastatin (LIPITOR) 80 MG tablet Take 40 mg by mouth daily.    . benztropine (COGENTIN) 1 MG tablet Take 1 mg by mouth 2 (two) times daily.    . clopidogrel (PLAVIX) 75 MG tablet Take 1 tablet by mouth daily.  4  . levETIRAcetam (KEPPRA) 250 MG tablet Take 1 tablet (250 mg total) by mouth 2 (two) times daily. 60  tablet 0  . metoprolol succinate (TOPROL-XL) 25 MG 24 hr tablet Take 12.5 mg by mouth daily.  2  . nitroGLYCERIN (NITROSTAT) 0.4 MG SL tablet Place 1 tablet (0.4 mg total) under the tongue every 5 (five) minutes as needed for chest pain. 25 tablet 1  . Paliperidone Palmitate ER (INVEGA SUSTENNA) 117 MG/0.75ML SUSY Inject into the muscle every 30 (thirty) days.    . risperiDONE (RISPERDAL) 3 MG tablet Take 3 mg by mouth 2 (two) times daily.    Marland Kitchen topiramate (TOPAMAX) 25 MG tablet Take 25 mg by mouth 2 (two) times daily as needed (Headache).     No current facility-administered medications for this visit.      Past Surgical History:  Procedure Laterality Date  . CORONARY ANGIOPLASTY WITH STENT PLACEMENT       Allergies  Allergen Reactions  . Mushroom Extract Complex Diarrhea and Nausea And Vomiting  . Other Hives    Dial Soap. - breaks out and blisters  . Trazodone And Nefazodone Other (See Comments)    Violent      Family History  Problem Relation Age of Onset  . Diabetes Mother   . Hypertension Mother   . Diabetes Sister   . Cancer Sister   . Diabetes Maternal Aunt   . Cancer  Paternal Aunt   . Diabetes Maternal Grandmother   . Hypertension Maternal Grandmother   . Hypertension Maternal Grandfather   . Cancer Maternal Grandfather        lung cancer  . Hypertension Paternal Grandmother   . Heart disease Father   . Diabetes Father      Social History Brad Randolph reports that he has been smoking. He has a 8.25 pack-year smoking history. He has never used smokeless tobacco. Brad Randolph reports that he drank alcohol.   Review of Systems CONSTITUTIONAL: No weight loss, fever, chills, weakness or fatigue.  HEENT: Eyes: No visual loss, blurred vision, double vision or yellow sclerae.No hearing loss, sneezing, congestion, runny nose or sore throat.  SKIN: No rash or itching.  CARDIOVASCULAR: per hpi RESPIRATORY: No shortness of breath, cough or sputum.  GASTROINTESTINAL: No  anorexia, nausea, vomiting or diarrhea. No abdominal pain or blood.  GENITOURINARY: No burning on urination, no polyuria NEUROLOGICAL: No headache, dizziness, syncope, paralysis, ataxia, numbness or tingling in the extremities. No change in bowel or bladder control.  MUSCULOSKELETAL: No muscle, back pain, joint pain or stiffness.  LYMPHATICS: No enlarged nodes. No history of splenectomy.  PSYCHIATRIC: No history of depression or anxiety.  ENDOCRINOLOGIC: No reports of sweating, cold or heat intolerance. No polyuria or polydipsia.  Marland Kitchen   Physical Examination Vitals:   04/09/18 1340 04/09/18 1346  BP: 118/78 116/76  Pulse: 77   SpO2: 96%    Vitals:   04/09/18 1340  Weight: 200 lb (90.7 kg)  Height: 5' 9"  (1.753 m)    Gen: resting comfortably, no acute distress HEENT: no scleral icterus, pupils equal round and reactive, no palptable cervical adenopathy,  CV: RRR, no m/r/g, no jvd Resp: Clear to auscultation bilaterally GI: abdomen is soft, non-tender, non-distended, normal bowel sounds, no hepatosplenomegaly MSK: extremities are warm, no edema.  Skin: warm, no rash Neuro:  no focal deficits Psych: appropriate affect   Diagnostic Studies 08/2017 echo SUMMARY The left ventricular size is normal.  Left ventricular systolic function is normal. LV ejection fraction = 55-60%.  Left ventricular filling pattern is normal. The right ventricle is normal in size and function. There is no significant valvular stenosis or regurgitation IVC size was mildly dilated. There is no pericardial effusion. There is no significant change in comparison with the last study.  01/2016 cath RESULTS:   Coronary arteriograms 1. Left main-normal 2. Left anterior descending-normal; D1-normal; D2-normal 3. Left circumflex-normal; OM1-normal; OM 2-normal 4. Right coronary-90% proximal at the takeoff of the RV marginal branch; 70% mid stenosis; PDA-normal  Left ventriculography 1. Normal left  ventricular chamber size. 2. Normal wall motion. 3. The estimated EF is 55-60%.   CONCLUSIONS:  1. Single vessel native coronary artery disease. 2. Successful PCI with placement of 2 drug-eluting stents in the proximal and mid RCA. 3. Normal left ventricular chamber size with normal wall motion, the estimated EF is 55-60%.   Assessment and Plan  1. CAD/Chest pain - recent chest pain, appears he has had intermittent chest pain off and on over the years since his stent - plan for lexiscan to further evaluate - his DAPT history is unclear to me, there was some question about a prior CVA and unclear if that's when full dose ASA and plavix was started. His stent is over 2 years ago. Review records further prior to making any changes         [name], M.D.

## 2018-04-16 ENCOUNTER — Encounter (HOSPITAL_COMMUNITY): Payer: Self-pay

## 2018-04-16 ENCOUNTER — Encounter (HOSPITAL_COMMUNITY)
Admission: RE | Admit: 2018-04-16 | Discharge: 2018-04-16 | Disposition: A | Discharge status: Home / Self Care | Payer: Self-pay | Source: Ambulatory Visit | Attending: Dermatology | Admitting: Dermatology

## 2018-04-16 ENCOUNTER — Ambulatory Visit (HOSPITAL_COMMUNITY)
Admission: RE | Admit: 2018-04-16 | Discharge: 2018-04-16 | Disposition: A | Discharge status: Home / Self Care | Payer: Self-pay | Source: Ambulatory Visit | Attending: Cardiology | Admitting: Cardiology

## 2018-04-16 DIAGNOSIS — R079 Chest pain, unspecified: Secondary | ICD-10-CM

## 2018-04-16 LAB — NM MYOCAR MULTI W/SPECT W/WALL MOTION / EF
LHR: 0.37
LV dias vol: 91 mL (ref 62–150)
LVSYSVOL: 36 mL
NUC STRESS TID: 1.02
Peak HR: 107 {beats}/min
Rest HR: 71 {beats}/min
SDS: 3
SRS: 1
SSS: 4

## 2018-04-16 MED ORDER — TECHNETIUM TC 99M TETROFOSMIN IV KIT
30.0000 | PACK | Freq: Once | INTRAVENOUS | Status: AC | PRN
  Administered 2018-04-16: 31 via INTRAVENOUS

## 2018-04-16 MED ORDER — REGADENOSON 0.4 MG/5ML IV SOLN
INTRAVENOUS | Status: AC
  Administered 2018-04-16: 0.4 mg via INTRAVENOUS
  Filled 2018-04-16: qty 5

## 2018-04-16 MED ORDER — SODIUM CHLORIDE 0.9% FLUSH
INTRAVENOUS | Status: AC
  Administered 2018-04-16: 10 mL via INTRAVENOUS
  Filled 2018-04-16: qty 10

## 2018-04-16 MED ORDER — TECHNETIUM TC 99M TETROFOSMIN IV KIT
10.0000 | PACK | Freq: Once | INTRAVENOUS | Status: AC | PRN
  Administered 2018-04-16: 10.8 via INTRAVENOUS

## 2018-04-17 ENCOUNTER — Encounter: Payer: Self-pay | Admitting: Diagnostic Neuroimaging

## 2018-04-17 ENCOUNTER — Ambulatory Visit: Payer: Self-pay | Admitting: Diagnostic Neuroimaging

## 2018-04-17 VITALS — BP 133/80 | HR 84 | Ht 69.0 in | Wt 203.2 lb

## 2018-04-17 DIAGNOSIS — R569 Unspecified convulsions: Secondary | ICD-10-CM

## 2018-04-17 DIAGNOSIS — F3130 Bipolar disorder, current episode depressed, mild or moderate severity, unspecified: Secondary | ICD-10-CM | POA: Insufficient documentation

## 2018-04-17 DIAGNOSIS — R9089 Other abnormal findings on diagnostic imaging of central nervous system: Secondary | ICD-10-CM | POA: Insufficient documentation

## 2018-04-17 DIAGNOSIS — G43109 Migraine with aura, not intractable, without status migrainosus: Secondary | ICD-10-CM | POA: Insufficient documentation

## 2018-04-17 DIAGNOSIS — F603 Borderline personality disorder: Secondary | ICD-10-CM | POA: Insufficient documentation

## 2018-04-17 NOTE — Patient Instructions (Signed)
NON-EPILEPTIC SPELLS - would recommend tapering off levetiracetam; reduce to 1 tab per day for 2 weeks, then stop  MIGRAINE WITH AURA - may consider SSRI, amitriptyline or depakote in future (would consult with psychiatry)  BRAIN ANEURYSM (asymptomatic, unruptured; ? 2.26m hypophyseal aneurysm on right; possible 1.575maneurysm on left) - repeat CTA head in March 2020  [place] with [place]ry

## 2018-04-17 NOTE — Progress Notes (Signed)
[facility]  PATIENT: Brad Randolph DOB: 02-06-1976  REFERRING CLINICIAN: Soyla Dryer, Pa-c [place], [place] [address]  HISTORY FROM: patient  REASON FOR VISIT: new consult    HISTORICAL  CHIEF COMPLAINT:  Chief Complaint  Patient presents with  . Headache    rm 7, New Pt, sister- Cecille Rubin, " seizures, migraine headaches, hard time speaking and thinking clear, not a good memory, left arm/leg weak, get shakes, blurred and tunnel vision"    HISTORY OF PRESENT ILLNESS:   42 year old male here for evaluation of headache, aneurysm, memory problems, speech problems, seizure.  Regarding headaches patient has had intermittent sharp and throbbing headaches since age 42 year old with nausea, photophobia, blurred vision.  Patient now having headaches almost on daily basis.  Strong family history of migraine headaches.  Patient is never officially been diagnosed with migraine headaches but he has tried topiramate and Tylenol in the past without relief.  Regarding aneurysm, patient was evaluated in [place] for possible seizure or stroke, had CTA of the head which showed 1.5 mm and 2.5 mm hypophyseal aneurysms.  These were unruptured.  No evidence of stroke.  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  However patient was evaluated in [place] in the last year and apparently was started on levetiracetam.  Patient continues to take levetiracetam 250 mg twice a day.  Patient also having trouble with concentration, thinking, speech and language.  These have been evaluated in the past with MRI and neurologic consultation, and patient was diagnosed with nonorganic/functional/psychogenic etiology of symptoms.  She does have history of prior traumatic childhood, head injury, bipolar disorder, borderline personality  disorder.   REVIEW OF SYSTEMS: Full 14 system review of systems performed and negative with exception of: Fatigue chest pain blurred vision memory loss confusion headache numbness weakness dizziness tremor insomnia depression anxiety suicidal thoughts racing thoughts not asleep impotence diarrhea joint pain ringing in ears spinning sensation.   ALLERGIES: Allergies  Allergen Reactions  . Soap Rash    Dial soap peels skin  Dial soap peels skin  Dial soap peels skin    . Tramadol Other (See Comments)    "mean" and aggressive per pt "mean" and aggressive per pt "mean" and aggressive per pt aggression    . Mushroom Extract Complex Diarrhea and Nausea And Vomiting  . Other Hives    Dial Soap. - breaks out and blisters  . Trazodone And Nefazodone Other (See Comments)    Violent    HOME MEDICATIONS: Outpatient Medications Prior to Visit  Medication Sig Dispense Refill  . amLODipine (NORVASC) 5 MG tablet Take 1 tablet (5 mg total) by mouth daily. 90 tablet 3  . aspirin EC 325 MG tablet Take 1 tablet (325 mg total) by mouth daily. 90 tablet 3  . atorvastatin (LIPITOR) 80 MG tablet Take 0.5 tablets (40 mg total) by mouth daily. 45 tablet 3  . benztropine (COGENTIN) 1 MG tablet Take 1 mg by mouth 2 (two) times daily.    . clopidogrel (PLAVIX) 75 MG tablet Take 1 tablet (75 mg total) by mouth daily. 90 tablet 3  . levETIRAcetam (KEPPRA) 250 MG tablet Take 1 tablet (250 mg total) by mouth 2 (two) times daily. 60 tablet 0  . metoprolol succinate (TOPROL-XL) 25 MG 24 hr tablet Take 0.5 tablets (12.5 mg total) by mouth daily. 45 tablet 3  .  Paliperidone Palmitate ER (INVEGA SUSTENNA) 117 MG/0.75ML SUSY Inject into the muscle every 30 (thirty) days.    Marland Kitchen topiramate (TOPAMAX) 25 MG tablet Take 25 mg by mouth 2 (two) times daily as needed (Headache).    . nitroGLYCERIN (NITROSTAT) 0.4 MG SL tablet Place 1 tablet (0.4 mg total) under the tongue every 5 (five) minutes as needed for chest pain.  (Patient not taking: Reported on 04/17/2018) 25 tablet 1  . risperiDONE (RISPERDAL) 3 MG tablet Take 3 mg by mouth 2 (two) times daily.     No facility-administered medications prior to visit.     PAST MEDICAL HISTORY: Past Medical History:  Diagnosis Date  . Anxiety   . Asthma    chilldhood  . Bipolar disorder ([place])   . Borderline personality disorder ([place])   . CAD (coronary artery disease)   . Depression   . Heart attack ([place])   . Hyperlipidemia   . Hypertension   . Migraine   . Seizures ([place]) 2018   last sz 12/2017    PAST SURGICAL HISTORY: Past Surgical History:  Procedure Laterality Date  . CORONARY ANGIOPLASTY WITH STENT PLACEMENT      FAMILY HISTORY: Family History  Problem Relation Age of Onset  . Diabetes Mother   . Hypertension Mother   . Diabetes Sister   . Cancer Sister   . Diabetes Maternal Aunt   . Cancer Paternal Aunt   . Diabetes Maternal Grandmother   . Hypertension Maternal Grandmother   . Hypertension Maternal Grandfather   . Cancer Maternal Grandfather        lung cancer  . Hypertension Paternal Grandmother   . Heart disease Father   . Diabetes Father     SOCIAL HISTORY: Social History   Socioeconomic History  . Marital status: Legally Separated    Spouse name: Not on file  . Number of children: 2  . Years of education: 24  . Highest education level: Not on file  Occupational History    Comment: NA  Social Needs  . Financial resource strain: Not on file  . Food insecurity:    Worry: Not on file    Inability: Not on file  . Transportation needs:    Medical: Not on file    Non-medical: Not on file  Tobacco Use  . Smoking status: Current Every Day Smoker    Packs/day: 0.25    Years: 33.00    Pack years: 8.25  . Smokeless tobacco: Never Used  Substance and Sexual Activity  . Alcohol use: Not Currently    Frequency: Never    Comment: previously drunk socially  . Drug use: Not Currently    Types: Marijuana, Methamphetamines     Comment: last use 2015. 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 Sexual activity: Not on file  Lifestyle  . Physical activity:    Days per week: Not on file    Minutes per session: Not on file  . Stress: Not on file  Relationships  . Social connections:    Talks on phone: Not on file    Gets together: Not on file    Attends religious service: Not on file    Active member of club or organization: Not on file    Attends meetings of clubs or organizations: Not on file    Relationship status: Not on file  . Intimate partner violence:    Fear of current or ex partner: Not on file    Emotionally abused: Not on file  Physically abused: Not on file    Forced sexual activity: Not on file  Other Topics Concern  . Not on file  Social History Narrative   Lives with sister, Lori's daughter   Tea, some Coke     PHYSICAL EXAM  GENERAL EXAM/CONSTITUTIONAL: Vitals:  Vitals:   04/17/18 0810  BP: 133/80  Pulse: 84  Weight: 203 lb 3.2 oz (92.2 kg)  Height: 5' 9"  (1.753 m)     Body mass index is 30.01 kg/m. Wt Readings from Last 3 Encounters:  04/17/18 203 lb 3.2 oz (92.2 kg)  04/09/18 200 lb (90.7 kg)  03/18/18 195 lb 8 oz (88.7 kg)     Patient is in no distress; well developed, nourished and groomed; neck is supple  CARDIOVASCULAR:  Examination of carotid arteries is normal; no carotid bruits  Regular rate and rhythm, no murmurs  Examination of peripheral vascular system by observation and palpation is normal  EYES:  Ophthalmoscopic exam of optic discs and posterior segments is normal; no papilledema or hemorrhages  Vision Screening Comments: Tunnel vision, blurred vision  MUSCULOSKELETAL:  Gait, strength, tone, movements noted in Neurologic exam below  NEUROLOGIC: MENTAL STATUS:  No flowsheet data found.  awake, alert, oriented to person, place and time  recent and remote memory intact  normal attention and concentration  STUTTERING, SLOW SPEECH; COMPREHENSION  INTACT  fund of knowledge appropriate  CRANIAL NERVE:   2nd - no papilledema on fundoscopic exam  2nd, 3rd, 4th, 6th - pupils equal and reactive to light, visual fields full to confrontation, extraocular muscles intact, no nystagmus  5th - facial sensation symmetric  7th - facial strength symmetric  8th - hearing intact  9th - palate elevates symmetrically, uvula midline  11th - shoulder shrug symmetric  12th - tongue protrusion midline  MOTOR:   normal bulk and tone, full strength in the BUE, BLE  SLOW MOVEMENTS  SENSORY:   normal and symmetric to light touch, temperature, vibration  COORDINATION:   finger-nose-finger, fine finger movements normal  REFLEXES:   deep tendon reflexes present and symmetric  GAIT/STATION:   narrow based gait     DIAGNOSTIC DATA (LABS, IMAGING, TESTING) - I reviewed patient records, labs, notes, testing and imaging myself where available.  Lab Results  Component Value Date   WBC 10.8 (H) 01/22/2018   HGB 14.0 01/22/2018   HCT 41.7 01/22/2018   MCV 92.1 01/22/2018   PLT 190 01/22/2018      Component Value Date/Time   NA 140 01/22/2018 1549   K 3.7 01/22/2018 1549   CL 106 01/22/2018 1549   CO2 26 01/22/2018 1549   GLUCOSE 121 (H) 01/22/2018 1549   BUN 12 01/22/2018 1549   CREATININE 0.94 01/22/2018 1549   CALCIUM 9.2 01/22/2018 1549   GFRNONAA >60 01/22/2018 1549   GFRAA >60 01/22/2018 1549   No results found for: CHOL, HDL, LDLCALC, LDLDIRECT, TRIG, CHOLHDL No results found for: HGBA1C No results found for: VITAMINB12 No results found for: TSH   09/01/17 MRI brain w/wo / MRA head / MRA neck 1. No acute intracranial abnormality identified. 2. No acute vascular abnormality.  09/10/17 CTA head / neck [report only] - ? 2.23m hypophyseal aneurysm on right; possible 1.588maneurysm on left    ASSESSMENT AND PLAN  [age].o. year old male here with:  Dx:  1. Nonepileptic episode ([place]  2. Migraine with aura  and without status migrainosus, not intractable   3. Abnormal CT of brain  4. Borderline personality disorder ([place])   5. Bipolar affective disorder, current episode depressed, current episode severity unspecified ([place])       PLAN:  NON-EPILEPTIC SPELLS - patient previously diagnosed with non-epileptic spells; would recommend tapering off levetiracetam; reduce to 1 tab per day for 2 weeks, then stop  MIGRAINE WITH AURA - intolerant of topiramate - may consider SSRI or amitriptyline in future (would consult with psychiatry)  BRAIN ANEURYSM (asymptomatic, unruptured; ? 2.2m hypophyseal aneurysm on right; possible 1.571maneurysm on left) - repeat CTA head in March 2020  [place] with [place]ry  DUAL ANTI-PLATELET USE - unclear reason for this; no evidence of prior stroke or intracranial atherosclerosis - ok to to reduce to single antiplatelet use from neurologic standpoint; will inform cardiology and PCP  [place] UNINSURED STATUS - medicaid is pending   Return in about 6 months (around 10/17/2018).    VIPenni BombardMD [id number] Certified in Neurology, Neurophysiology and Neuroimaging  [facility] [id number] CourtSuWarsawrIna[place] [id number]

## 2018-04-29 ENCOUNTER — Encounter: Payer: Self-pay | Admitting: Physician Assistant

## 2018-04-29 ENCOUNTER — Ambulatory Visit: Payer: Self-pay | Admitting: Physician Assistant

## 2018-04-29 VITALS — BP 138/93 | HR 88 | Temp 97.9°F | Ht 69.0 in | Wt 203.0 lb

## 2018-04-29 DIAGNOSIS — I671 Cerebral aneurysm, nonruptured: Secondary | ICD-10-CM

## 2018-04-29 DIAGNOSIS — F603 Borderline personality disorder: Secondary | ICD-10-CM

## 2018-04-29 DIAGNOSIS — I2510 Atherosclerotic heart disease of native coronary artery without angina pectoris: Secondary | ICD-10-CM

## 2018-04-29 DIAGNOSIS — R569 Unspecified convulsions: Secondary | ICD-10-CM

## 2018-04-29 DIAGNOSIS — F3130 Bipolar disorder, current episode depressed, mild or moderate severity, unspecified: Secondary | ICD-10-CM

## 2018-04-29 DIAGNOSIS — I10 Essential (primary) hypertension: Secondary | ICD-10-CM

## 2018-04-29 NOTE — Progress Notes (Signed)
BP (!) 138/93 (BP Location: Right Arm, Patient Position: Sitting, Cuff Size: Normal)   Pulse 88   Temp 97.9 F (36.6 C)   Ht 5' 9"  (1.753 m)   Wt 203 lb (92.1 kg)   SpO2 98%   BMI 29.98 kg/m    Subjective:    Patient ID: Brad Randolph, male    DOB: 01-25-1976, 42 y.o.   MRN: [id number]  HPI: Brad Randolph is a 42 y.o. male presenting on 04/29/2018 for Follow-up   HPI   [name] is with him today.  Pt has been seen by cardiology and neurology.   Neither recommend continuation of DAPT.    Pt continues with MH in [place].  He is planning to change to [facility].  He has appointment Nov 7 to see therapist at [facility] and appt Nov 11 with psychiatrist.   Pt says he took NTG twice since last appt.  He thinks it helped.  He is weaning off the keppra per neurology.  Pt supposed to hear from medicaid by dec 9.  Pt has no new issues today.     Relevant past medical, surgical, family and social history reviewed and updated as indicated. Interim medical history since our last visit reviewed. Allergies and medications reviewed and updated.   Current Outpatient Medications:  .  ACETAMINOPHEN PO, Take by mouth., Disp: , Rfl:  .  amLODipine (NORVASC) 5 MG tablet, Take 1 tablet (5 mg total) by mouth daily., Disp: 90 tablet, Rfl: 3 .  aspirin EC 325 MG tablet, Take 1 tablet (325 mg total) by mouth daily., Disp: 90 tablet, Rfl: 3 .  atorvastatin (LIPITOR) 80 MG tablet, Take 0.5 tablets (40 mg total) by mouth daily., Disp: 45 tablet, Rfl: 3 .  benztropine (COGENTIN) 1 MG tablet, Take 1 mg by mouth 2 (two) times daily., Disp: , Rfl:  .  clopidogrel (PLAVIX) 75 MG tablet, Take 1 tablet (75 mg total) by mouth daily., Disp: 90 tablet, Rfl: 3 .  levETIRAcetam (KEPPRA) 250 MG tablet, Take 1 tablet (250 mg total) by mouth 2 (two) times daily., Disp: 60 tablet, Rfl: 0 .  metoprolol succinate (TOPROL-XL) 25 MG 24 hr tablet, Take 0.5 tablets (12.5 mg total) by mouth daily., Disp: 45 tablet, Rfl: 3 .   nitroGLYCERIN (NITROSTAT) 0.4 MG SL tablet, Place 1 tablet (0.4 mg total) under the tongue every 5 (five) minutes as needed for chest pain., Disp: 25 tablet, Rfl: 1 .  Paliperidone Palmitate ER (INVEGA SUSTENNA) 117 MG/0.75ML SUSY, Inject into the muscle every 30 (thirty) days., Disp: , Rfl:  .  topiramate (TOPAMAX) 25 MG tablet, Take 25 mg by mouth 2 (two) times daily as needed (Headache)., Disp: , Rfl:  .  risperiDONE (RISPERDAL) 3 MG tablet, Take 3 mg by mouth 2 (two) times daily., Disp: , Rfl:    Review of Systems  Constitutional: Positive for chills and fatigue. Negative for appetite change, diaphoresis, fever and unexpected weight change.  HENT: Positive for dental problem, drooling and trouble swallowing. Negative for congestion, ear pain, facial swelling, hearing loss, mouth sores, sneezing, sore throat and voice change.   Eyes: Positive for pain and visual disturbance. Negative for discharge, redness and itching.  Respiratory: Positive for shortness of breath. Negative for cough, choking and wheezing.   Cardiovascular: Positive for chest pain. Negative for palpitations and leg swelling.  Gastrointestinal: Negative for abdominal pain, blood in stool, constipation, diarrhea and vomiting.  Endocrine: Positive for cold intolerance. Negative for heat intolerance and  polydipsia.  Genitourinary: Negative for decreased urine volume, dysuria and hematuria.  Musculoskeletal: Positive for arthralgias, back pain and gait problem.  Skin: Negative for rash.  Allergic/Immunologic: Negative for environmental allergies.  Neurological: Positive for seizures and syncope. Negative for light-headedness and headaches.  Hematological: Negative for adenopathy.  Psychiatric/Behavioral: Positive for agitation and dysphoric mood. Negative for suicidal ideas. The patient is nervous/anxious.     Per HPI unless specifically indicated above     Objective:    BP (!) 138/93 (BP Location: Right Arm, Patient  Position: Sitting, Cuff Size: Normal)   Pulse 88   Temp 97.9 F (36.6 C)   Ht 5' 9"  (1.753 m)   Wt 203 lb (92.1 kg)   SpO2 98%   BMI 29.98 kg/m   Wt Readings from Last 3 Encounters:  04/29/18 203 lb (92.1 kg)  04/17/18 203 lb 3.2 oz (92.2 kg)  04/09/18 200 lb (90.7 kg)    Physical Exam  Constitutional: He is oriented to person, place, and time. He appears well-developed and well-nourished.  HENT:  Head: Normocephalic and atraumatic.  Neck: Neck supple.  Cardiovascular: Normal rate and regular rhythm.  Pulmonary/Chest: Effort normal and breath sounds normal. He has no wheezes.  Abdominal: Soft. Bowel sounds are normal. There is no hepatosplenomegaly. There is no tenderness.  Musculoskeletal: He exhibits no edema.  Lymphadenopathy:    He has no cervical adenopathy.  Neurological: He is alert and oriented to person, place, and time.  Pt still with weak L grip strength.  He is still talking very carefully and slowly, searching for correct words to express himself.   Skin: Skin is warm and dry.  Psychiatric: He has a normal mood and affect. His behavior is normal.  Vitals reviewed.       Assessment & Plan:   Encounter Diagnoses  Name Primary?  . Intracranial aneurysm   . Essential hypertension Yes  . 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 Nonepileptic episode ([place])   . Borderline personality disorder ([place])   . Bipolar affective disorder, current episode depressed, current episode severity unspecified ([place])     -will discontinue plavix.  Continue ASA.  No current indications for DAPT.   -no other medication changes today. -pt to continue with cardiology, neuorolgy and [facility] -pt to follow up here 2 months.  RTO sooner prn

## 2018-05-21 ENCOUNTER — Ambulatory Visit: Payer: Self-pay | Admitting: Student

## 2018-06-25 ENCOUNTER — Ambulatory Visit (INDEPENDENT_AMBULATORY_CARE_PROVIDER_SITE_OTHER): Payer: Self-pay | Admitting: Cardiology

## 2018-06-25 ENCOUNTER — Encounter: Payer: Self-pay | Admitting: Cardiology

## 2018-06-25 VITALS — BP 116/84 | HR 102 | Ht 68.0 in | Wt 205.2 lb

## 2018-06-25 DIAGNOSIS — I2510 Atherosclerotic heart disease of native coronary artery without angina pectoris: Secondary | ICD-10-CM

## 2018-06-25 DIAGNOSIS — R079 Chest pain, unspecified: Secondary | ICD-10-CM | POA: Insufficient documentation

## 2018-06-25 DIAGNOSIS — F603 Borderline personality disorder: Secondary | ICD-10-CM

## 2018-06-25 DIAGNOSIS — I10 Essential (primary) hypertension: Secondary | ICD-10-CM

## 2018-06-25 DIAGNOSIS — Z9861 Coronary angioplasty status: Secondary | ICD-10-CM

## 2018-06-25 DIAGNOSIS — F3130 Bipolar disorder, current episode depressed, mild or moderate severity, unspecified: Secondary | ICD-10-CM

## 2018-06-25 MED ORDER — METOPROLOL SUCCINATE ER 25 MG PO TB24: 12.5000 mg | ORAL_TABLET | Freq: Every day | ORAL | 3 refills | Status: DC

## 2018-06-25 NOTE — Progress Notes (Signed)
06/25/2018 Brad Randolph   20-Mar-1976  [id number]  Primary Physician Soyla Dryer, PA-C Primary Cardiologist: Dr Harl Bowie  HPI: Patient is a 42 year old male with a history of coronary disease, and a history of psychiatric disorders.  He had a non-ST elevation MI in July 2017 at [facility].  He received to RCA DES.  Since then he has had intermittent chest pain.  He had a low risk Myoview in 2018, echocardiogram in March 2019 showed normal LV function.  He has been evaluated by an neurology in the past for possible stroke and TIA.  He was given a diagnosis of conversion disorder and pseudoseizures.  He is in the office today for follow-up.  He had seen Dr. Harl Bowie in October and complained of chest pain.  Symptoms were not always exertional.  Myoview was done 04/16/2018 and was low risk.  Patient is in the office with a sister.  The patient is able to give history but he has some stuttering speech.  He does still have chest pain off and on but it is not reproducible and not always exertional.  He tells me he wants to go back to work.  His sister says he needs either a work release or disability.  The patient has been out of his Toprol for a week or so.  His heart rate at rest is 100.   Current Outpatient Medications  Medication Sig Dispense Refill  . ACETAMINOPHEN PO Take by mouth.    Marland Kitchen amLODipine (NORVASC) 5 MG tablet Take 1 tablet (5 mg total) by mouth daily. 90 tablet 3  . aspirin EC 325 MG tablet Take 1 tablet (325 mg total) by mouth daily. 90 tablet 3  . atorvastatin (LIPITOR) 80 MG tablet Take 0.5 tablets (40 mg total) by mouth daily. 45 tablet 3  . benztropine (COGENTIN) 1 MG tablet Take 1 mg by mouth 2 (two) times daily.    . nitroGLYCERIN (NITROSTAT) 0.4 MG SL tablet Place 1 tablet (0.4 mg total) under the tongue every 5 (five) minutes as needed for chest pain. 25 tablet 1  . Paliperidone Palmitate ER (INVEGA SUSTENNA) 117 MG/0.75ML SUSY Inject into the muscle every 30 (thirty)  days.    Marland Kitchen topiramate (TOPAMAX) 25 MG tablet Take 25 mg by mouth 2 (two) times daily as needed (Headache).    . metoprolol succinate (TOPROL XL) 25 MG 24 hr tablet Take 0.5 tablets (12.5 mg total) by mouth daily. 45 tablet 3   No current facility-administered medications for this visit.     Allergies  Allergen Reactions  . Soap Rash    Dial soap peels skin  Dial soap peels skin  Dial soap peels skin    . Tramadol Other (See Comments)    "mean" and aggressive per pt "mean" and aggressive per pt "mean" and aggressive per pt aggression    . Mushroom Extract Complex Diarrhea and Nausea And Vomiting  . Other Hives    Dial Soap. - breaks out and blisters  . Trazodone And Nefazodone Other (See Comments)    Violent    Past Medical History:  Diagnosis Date  . Anxiety   . Asthma    chilldhood  . Bipolar disorder ([place])   . Borderline personality disorder ([place])   . CAD (coronary artery disease)   . Depression   . Heart attack ([place])   . Hyperlipidemia   . Hypertension   . Migraine   . Seizures ([place]) 2018   last sz 12/2017  Social History   Socioeconomic History  . Marital status: Legally Separated    Spouse name: Not on file  . Number of children: 2  . Years of education: 60  . Highest education level: Not on file  Occupational History    Comment: NA  Social Needs  . Financial resource strain: Not on file  . Food insecurity:    Worry: Not on file    Inability: Not on file  . Transportation needs:    Medical: Not on file    Non-medical: Not on file  Tobacco Use  . Smoking status: Current Every Day Smoker    Packs/day: 0.25    Years: 33.00    Pack years: 8.25  . Smokeless tobacco: Never Used  Substance and Sexual Activity  . Alcohol use: Not Currently    Frequency: Never    Comment: previously drunk socially  . 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 Sexual activity: Not on file    Lifestyle  . Physical activity:    Days per week: Not on file    Minutes per session: Not on file  . Stress: Not on file  Relationships  . Social connections:    Talks on phone: Not on file    Gets together: Not on file    Attends religious service: Not on file    Active member of club or organization: Not on file    Attends meetings of clubs or organizations: Not on file    Relationship status: Not on file  . Intimate partner violence:    Fear of current or ex partner: Not on file    Emotionally abused: Not on file    Physically abused: Not on file    Forced sexual activity: Not on file  Other Topics Concern  . Not on file  Social History Narrative   Lives with sister, Lori's daughter   Tea, some Coke     Family History  Problem Relation Age of Onset  . Diabetes Mother   . Hypertension Mother   . Diabetes Sister   . Cancer Sister   . Diabetes Maternal Aunt   . Cancer Paternal Aunt   . Diabetes Maternal Grandmother   . Hypertension Maternal Grandmother   . Hypertension Maternal Grandfather   . Cancer Maternal Grandfather        lung cancer  . Hypertension Paternal Grandmother   . Heart disease Father   . Diabetes Father      Review of Systems: General: negative for chills, fever, night sweats or weight changes.  Cardiovascular: negative for chest pain, dyspnea on exertion, edema, orthopnea, palpitations, paroxysmal nocturnal dyspnea or shortness of breath Dermatological: negative for rash Respiratory: negative for cough or wheezing Urologic: negative for hematuria Abdominal: negative for nausea, vomiting, diarrhea, bright red blood per rectum, melena, or hematemesis Neurologic: negative for visual changes, syncope, or dizziness All other systems reviewed and are otherwise negative except as noted above.    Blood pressure 116/84, pulse (!) 102, height 5' 8"  (1.727 m), weight 205 lb 3.2 oz (93.1 kg), SpO2 98 %.  General appearance: alert, cooperative, no  distress and disheveled appearance Neck: no carotid bruit and no JVD Lungs: clear to auscultation bilaterally Heart: regular rate and rhythm Extremities: no edema Skin: Skin color, texture, turgor normal. No rashes or lesions Neurologic: Grossly normal   ASSESSMENT AND PLAN:   Chest pain Pt complains  of intermittent chest pain- its not clear this is angina. Myoview negative 10/19, and he has been out of his beta blcoker  CAD S/P percutaneous coronary angioplasty RCA PCI with DES Aug 2017- Low risk Myoview Oct 2018 and Oct 2019  Essential hypertension Controlled  Borderline personality disorder ([place]) .   PLAN  Resume Toprol and see if his chest pain doesn't improve. F/U in 2-3 weeks. It sounds like he was placed on a full dose ASA by neurology when he was taken off Brilinta-possible TIA.  Kerin Ransom PA-C 06/25/2018 2:07 PM

## 2018-06-25 NOTE — Assessment & Plan Note (Signed)
RCA PCI with DES Aug 2017- Low risk Myoview Oct 2018 and Oct 2019

## 2018-06-25 NOTE — Patient Instructions (Signed)
Medication Instructions:  Resume Toprol XL 12.5 mg daily  If you need a refill on your cardiac medications before your next appointment, please call your pharmacy.   Lab work: None Today If you have labs (blood work) drawn today and your tests are completely normal, you will receive your results only by: Marland Kitchen MyChart Message (if you have MyChart) OR . A paper copy in the mail If you have any lab test that is abnormal or we need to change your treatment, we will call you to review the results.  Testing/Procedures: None Today  Follow-Up: At [facility], you and your health needs are our priority.  As part of our continuing mission to provide you with exceptional heart care, we have created designated Provider Care Teams.  These Care Teams include your primary Cardiologist (physician) and Advanced Practice Providers (APPs -  Physician Assistants and Nurse Practitioners) who all work together to provide you with the care you need, when you need it. You will need a follow up appointment in 3 weeks.  Please call our office 2 months in advance to schedule this appointment.  You may see Carlyle Dolly, MD or one of the following Advanced Practice Providers on your designated Care Team:   Bernerd Pho, PA-C [facility]) . Ermalinda Barrios, PA-C ([place])  Any Other Special Instructions Will Be Listed Below (If Applicable). none

## 2018-06-25 NOTE — Assessment & Plan Note (Signed)
Controlled.  

## 2018-06-25 NOTE — Assessment & Plan Note (Signed)
Pt complains of intermittent chest pain- its not clear this is angina. Myoview negative 10/19, and he has been out of his beta blcoker

## 2018-07-06 ENCOUNTER — Other Ambulatory Visit (HOSPITAL_COMMUNITY)
Admission: RE | Admit: 2018-07-06 | Discharge: 2018-07-06 | Disposition: A | Discharge status: Home / Self Care | Payer: Self-pay | Source: Ambulatory Visit | Attending: Physician Assistant | Admitting: Physician Assistant

## 2018-07-06 ENCOUNTER — Encounter: Payer: Self-pay | Admitting: Physician Assistant

## 2018-07-06 ENCOUNTER — Ambulatory Visit: Payer: Self-pay | Admitting: Physician Assistant

## 2018-07-06 VITALS — BP 136/81 | HR 88 | Temp 97.9°F | Ht 69.0 in | Wt 220.0 lb

## 2018-07-06 DIAGNOSIS — I10 Essential (primary) hypertension: Secondary | ICD-10-CM | POA: Insufficient documentation

## 2018-07-06 DIAGNOSIS — Z131 Encounter for screening for diabetes mellitus: Secondary | ICD-10-CM

## 2018-07-06 DIAGNOSIS — F603 Borderline personality disorder: Secondary | ICD-10-CM

## 2018-07-06 DIAGNOSIS — I2510 Atherosclerotic heart disease of native coronary artery without angina pectoris: Secondary | ICD-10-CM

## 2018-07-06 DIAGNOSIS — F3130 Bipolar disorder, current episode depressed, mild or moderate severity, unspecified: Secondary | ICD-10-CM

## 2018-07-06 DIAGNOSIS — R079 Chest pain, unspecified: Secondary | ICD-10-CM

## 2018-07-06 LAB — COMPREHENSIVE METABOLIC PANEL
ALT: 31 U/L (ref 0–44)
ANION GAP: 8 (ref 5–15)
AST: 22 U/L (ref 15–41)
Albumin: 4.1 g/dL (ref 3.5–5.0)
Alkaline Phosphatase: 56 U/L (ref 38–126)
BILIRUBIN TOTAL: 1.8 mg/dL — AB (ref 0.3–1.2)
BUN: 15 mg/dL (ref 6–20)
CHLORIDE: 105 mmol/L (ref 98–111)
CO2: 23 mmol/L (ref 22–32)
Calcium: 9.3 mg/dL (ref 8.9–10.3)
Creatinine, Ser: 0.78 mg/dL (ref 0.61–1.24)
Glucose, Bld: 108 mg/dL — ABNORMAL HIGH (ref 70–99)
POTASSIUM: 4 mmol/L (ref 3.5–5.1)
Sodium: 136 mmol/L (ref 135–145)
TOTAL PROTEIN: 7.6 g/dL (ref 6.5–8.1)

## 2018-07-06 LAB — LIPID PANEL
CHOL/HDL RATIO: 4.3 ratio
CHOLESTEROL: 124 mg/dL (ref 0–200)
HDL: 29 mg/dL — AB (ref >40)
LDL CALC: 71 mg/dL (ref 0–99)
TRIGLYCERIDES: 118 mg/dL (ref <150)
VLDL: 24 mg/dL (ref 0–40)

## 2018-07-06 LAB — HEMOGLOBIN A1C
Hgb A1c MFr Bld: 5.2 % (ref 4.8–5.6)
Mean Plasma Glucose: 102.54 mg/dL

## 2018-07-06 MED ORDER — METOPROLOL TARTRATE 25 MG PO TABS
25.0000 mg | ORAL_TABLET | Freq: Two times a day (BID) | ORAL | 1 refills | Status: DC
Start: 2018-07-06 — End: 2018-12-22

## 2018-07-06 NOTE — Progress Notes (Signed)
BP 136/81 (BP Location: Right Arm, Patient Position: Sitting, Cuff Size: Normal)   Pulse 88   Temp 97.9 F (36.6 C) (Other (Comment))   Ht 5' 9"  (1.753 m)   Wt 220 lb (99.8 kg)   SpO2 98%   BMI 32.49 kg/m    Subjective:    Patient ID: Brad Randolph, male    DOB: 02-12-1976, 43 y.o.   MRN: [id number]  HPI: Brad Randolph is a 43 y.o. male presenting on 07/06/2018 for Follow-up   HPI   Pt says he hasn't heard on his medicaid.  He says he was declined for disability.    He still goes to [facility] for mental health .  he says he is awaiting [facility].  He says he is in process of switching.    Pt says cardiology sent toprol xl to [facility] but he hasn't gotten it yet.  Discussed with pt that [facility] does not have toprol XL.  Will send metoprolol bid to [facility] as replacement  Pt has f/u with cardiology later in January  Pt has f/u with neurology in April  Pt asks about letter to return to work.  Discussed that he needs to get that from [facility] provider.  If he is cleared by cardiology, there is no other physical reason he should not RTW.     Relevant past medical, surgical, family and social history reviewed and updated as indicated. Interim medical history since our last visit reviewed. Allergies and medications reviewed and updated.   Current Outpatient Medications:  .  ACETAMINOPHEN PO, Take by mouth., Disp: , Rfl:  .  amLODipine (NORVASC) 5 MG tablet, Take 1 tablet (5 mg total) by mouth daily., Disp: 90 tablet, Rfl: 3 .  aspirin EC 325 MG tablet, Take 1 tablet (325 mg total) by mouth daily., Disp: 90 tablet, Rfl: 3 .  atorvastatin (LIPITOR) 80 MG tablet, Take 0.5 tablets (40 mg total) by mouth daily., Disp: 45 tablet, Rfl: 3 .  benztropine (COGENTIN) 1 MG tablet, Take 1 mg by mouth 2 (two) times daily., Disp: , Rfl:  .  nitroGLYCERIN (NITROSTAT) 0.4 MG SL tablet, Place 1 tablet (0.4 mg total) under the tongue every 5 (five) minutes as needed for chest  pain., Disp: 25 tablet, Rfl: 1 .  Paliperidone Palmitate ER (INVEGA SUSTENNA) 117 MG/0.75ML SUSY, Inject into the muscle every 30 (thirty) days., Disp: , Rfl:  .  topiramate (TOPAMAX) 25 MG tablet, Take 25 mg by mouth 2 (two) times daily as needed (Headache)., Disp: , Rfl:  .  metoprolol succinate (TOPROL XL) 25 MG 24 hr tablet, Take 0.5 tablets (12.5 mg total) by mouth daily. (Patient not taking: Reported on 07/06/2018), Disp: 45 tablet, Rfl: 3    Review of Systems  Constitutional: Positive for fatigue. Negative for appetite change, chills, diaphoresis, fever and unexpected weight change.  HENT: Positive for congestion, dental problem and drooling. Negative for ear pain, facial swelling, hearing loss, mouth sores, sneezing, sore throat, trouble swallowing and voice change.   Eyes: Positive for pain and visual disturbance. Negative for discharge, redness and itching.  Respiratory: Positive for shortness of breath. Negative for cough, choking and wheezing.   Cardiovascular: Positive for chest pain, palpitations and leg swelling.  Gastrointestinal: Negative for abdominal pain, blood in stool, constipation, diarrhea and vomiting.  Endocrine: Positive for cold intolerance. Negative for heat intolerance and polydipsia.  Genitourinary: Negative for decreased urine volume, dysuria and hematuria.  Musculoskeletal: Positive for arthralgias and  back pain. Negative for gait problem.  Skin: Negative for rash.  Allergic/Immunologic: Positive for environmental allergies.  Neurological: Positive for seizures and headaches. Negative for syncope and light-headedness.  Hematological: Negative for adenopathy.  Psychiatric/Behavioral: Positive for agitation, dysphoric mood and suicidal ideas. The patient is nervous/anxious.     Per HPI unless specifically indicated above     Objective:    BP 136/81 (BP Location: Right Arm, Patient Position: Sitting, Cuff Size: Normal)   Pulse 88   Temp 97.9 F (36.6 C)  (Other (Comment))   Ht 5' 9"  (1.753 m)   Wt 220 lb (99.8 kg)   SpO2 98%   BMI 32.49 kg/m   Wt Readings from Last 3 Encounters:  07/06/18 220 lb (99.8 kg)  06/25/18 205 lb 3.2 oz (93.1 kg)  04/29/18 203 lb (92.1 kg)    Physical Exam Vitals signs reviewed.  Constitutional:      Appearance: He is well-developed.  HENT:     Head: Normocephalic and atraumatic.  Neck:     Musculoskeletal: Neck supple.  Cardiovascular:     Rate and Rhythm: Normal rate and regular rhythm.  Pulmonary:     Effort: Pulmonary effort is normal.     Breath sounds: Normal breath sounds. No wheezing.  Abdominal:     General: Bowel sounds are normal.     Palpations: Abdomen is soft.     Tenderness: There is no abdominal tenderness.  Lymphadenopathy:     Cervical: No cervical adenopathy.  Skin:    General: Skin is warm and dry.  Neurological:     Mental Status: He is alert and oriented to person, place, and time. Mental status is at baseline.         Assessment & Plan:    Encounter Diagnoses  Name Primary?  . Essential hypertension Yes  . 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 Screening for diabetes mellitus   . Borderline personality disorder ([place])   . Bipolar affective disorder, current episode depressed, current episode severity unspecified ([place])   . Chest pain, unspecified type      -Discussed with pt that if he is cleared by cardiology, we can get him a RTW letter stipulating that he also needs clearance from [facility]. Pt states understanding -will Update labs and call pt with results -pt to continue with cardiology and mental health per their recommendations -rx metoprolol sent to [facility] (to replace toprol sent by cardiology) -pt to follow up here 2 months.  RTO sooner prn

## 2018-07-21 ENCOUNTER — Encounter: Payer: Self-pay | Admitting: Student

## 2018-07-21 ENCOUNTER — Ambulatory Visit (INDEPENDENT_AMBULATORY_CARE_PROVIDER_SITE_OTHER): Payer: Self-pay | Admitting: Student

## 2018-07-21 VITALS — BP 124/74 | HR 61 | Ht 68.0 in | Wt 201.0 lb

## 2018-07-21 DIAGNOSIS — R079 Chest pain, unspecified: Secondary | ICD-10-CM

## 2018-07-21 DIAGNOSIS — Z720 Tobacco use: Secondary | ICD-10-CM

## 2018-07-21 DIAGNOSIS — I25118 Atherosclerotic heart disease of native coronary artery with other forms of angina pectoris: Secondary | ICD-10-CM

## 2018-07-21 DIAGNOSIS — I10 Essential (primary) hypertension: Secondary | ICD-10-CM

## 2018-07-21 DIAGNOSIS — E785 Hyperlipidemia, unspecified: Secondary | ICD-10-CM

## 2018-07-21 MED ORDER — ASPIRIN EC 81 MG PO TBEC: 81.0000 mg | DELAYED_RELEASE_TABLET | Freq: Every day | ORAL | 3 refills | Status: DC

## 2018-07-21 MED ORDER — ISOSORBIDE MONONITRATE ER 30 MG PO TB24: 30.0000 mg | ORAL_TABLET | Freq: Every day | ORAL | 3 refills | Status: DC

## 2018-07-21 NOTE — Patient Instructions (Signed)
Medication Instructions:  Your physician has recommended you make the following change in your medication:  Decrease Aspirin to 81 mg Daily   If you need a refill on your cardiac medications before your next appointment, please call your pharmacy.   Lab work: NONE   If you have labs (blood work) drawn today and your tests are completely normal, you will receive your results only by: Marland Kitchen MyChart Message (if you have MyChart) OR . A paper copy in the mail If you have any lab test that is abnormal or we need to change your treatment, we will call you to review the results.  Testing/Procedures: NONE  Follow-Up: At [facility], you and your health needs are our priority.  As part of our continuing mission to provide you with exceptional heart care, we have created designated Provider Care Teams.  These Care Teams include your primary Cardiologist (physician) and Advanced Practice Providers (APPs -  Physician Assistants and Nurse Practitioners) who all work together to provide you with the care you need, when you need it. . Your physician recommends that you schedule a follow-up appointment in: 2-3 Months with Dr. Harl Bowie   Any Other Special Instructions Will Be Listed Below (If Applicable). Thank you for choosing [facility]!

## 2018-07-21 NOTE — Progress Notes (Signed)
Cardiology Office Note    Date:  07/21/2018   ID:  Brad Randolph, DOB 06-18-1976, MRN [id number]  PCP:  Brad Dryer, PA-C  Cardiologist: Brad Dolly, MD    Chief Complaint  Patient presents with  . Follow-up    1 month visit    History of Present Illness:    Brad Randolph is a 43 y.o. male with past medical history of CAD (s/p DESx2 to RCA in 12/2015, low-risk NST in 03/2018), HTN, HLD, borderline personality disorder, questionable prior TIA (followed by Neurology), and continued tobacco use who presents to the office today for 5-monthfollow-up.   He was evaluated by Dr. BHarl Bowiein 03/2018 and reported intermittent episodes of chest discomfort occurring at rest or with activity. A Lexiscan Myoview was obtained for further evaluation and showed no perfusion defects consistent with prior infarct or current ischemia, overall being a low risk study.  He did follow-up with Brad Ransom PA-C on 06/25/2018 and reported still having intermittent episodes of chest pain and had also been without his Toprol-XL for a week with HR in the low-100's during his visit. He was restarted on beta-blocker therapy and close follow-up was recommended for reassessment once resumed.   In talking with the patient and his sister today, he reports intermittent episodes of chest discomfort typically occurring 2-3 times per week. This can occur at rest or with activity and typically resolves within several minutes. He reports having utilized SL NTG 2-3 times within the past 4+ months. He denies any associated nausea, vomiting, or diaphoresis. No recent dyspnea on exertion, orthopnea, PND, or lower extremity edema.  He was evaluated by Neurology in the interim and it was felt he had no indications to be on Plavix from their perspective and this was discontinued with him remaining on ASA.  Past Medical History:  Diagnosis Date  . Anxiety   . Asthma    chilldhood  . Bipolar disorder ([place]   . Borderline  personality disorder ([place]   . CAD (coronary artery disease)    a. s/p DESx2 to RCA in 12/2015 b. low-risk NST in 03/2018  . Depression   . Heart attack ([place]   . Hyperlipidemia   . Hypertension   . Migraine   . Seizures ([place] 2018   last sz 12/2017    Past Surgical History:  Procedure Laterality Date  . CORONARY ANGIOPLASTY WITH STENT PLACEMENT      Current Medications: Outpatient Medications Prior to Visit  Medication Sig Dispense Refill  . ACETAMINOPHEN PO Take by mouth.    .Marland KitchenamLODipine (NORVASC) 5 MG tablet Take 1 tablet (5 mg total) by mouth daily. 90 tablet 3  . atorvastatin (LIPITOR) 80 MG tablet Take 0.5 tablets (40 mg total) by mouth daily. 45 tablet 3  . benztropine (COGENTIN) 1 MG tablet Take 1 mg by mouth 2 (two) times daily.    . metoprolol tartrate (LOPRESSOR) 25 MG tablet Take 1 tablet (25 mg total) by mouth 2 (two) times daily. 180 tablet 1  . nitroGLYCERIN (NITROSTAT) 0.4 MG SL tablet Place 1 tablet (0.4 mg total) under the tongue every 5 (five) minutes as needed for chest pain. 25 tablet 1  . Paliperidone Palmitate ER (INVEGA SUSTENNA) 117 MG/0.75ML SUSY Inject into the muscle every 30 (thirty) days.    .Marland Kitchentopiramate (TOPAMAX) 25 MG tablet Take 25 mg by mouth 2 (two) times daily as needed (Headache).    .Marland Kitchenaspirin EC 325 MG tablet Take 1 tablet (325 mg total)  by mouth daily. 90 tablet 3   No facility-administered medications prior to visit.      Allergies:   Soap; Tramadol; Mushroom extract complex; Other; and Trazodone and nefazodone   Social History   Socioeconomic History  . Marital status: Legally Separated    Spouse name: Not on file  . Number of children: 2  . Years of education: 86  . Highest education level: Not on file  Occupational History    Comment: NA  Social Needs  . Financial resource strain: Not on file  . Food insecurity:    Worry: Not on file    Inability: Not on file  . Transportation needs:    Medical: Not on file    Non-medical:  Not on file  Tobacco Use  . Smoking status: Current Every Day Smoker    Packs/day: 0.25    Years: 33.00    Pack years: 8.25  . Smokeless tobacco: Never Used  Substance and Sexual Activity  . Alcohol use: Not Currently    Frequency: Never    Comment: previously drunk socially  . 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 Sexual activity: Not on file  Lifestyle  . Physical activity:    Days per week: Not on file    Minutes per session: Not on file  . Stress: Not on file  Relationships  . Social connections:    Talks on phone: Not on file    Gets together: Not on file    Attends religious service: Not on file    Active member of club or organization: Not on file    Attends meetings of clubs or organizations: Not on file    Relationship status: Not on file  Other Topics Concern  . Not on file  Social History Narrative   Lives with sister, Brad Randolph's daughter   Tea, some Coke     Family History:  The patient's family history includes Cancer in his maternal grandfather, paternal aunt, and sister; Diabetes in his father, maternal aunt, maternal grandmother, mother, and sister; Heart disease in his father; Hypertension in his maternal grandfather, maternal grandmother, mother, and paternal grandmother.   Review of Systems:   Please see the history of present illness.     General:  No chills, fever, night sweats or weight changes.  Cardiovascular:  No dyspnea on exertion, edema, orthopnea, palpitations, paroxysmal nocturnal dyspnea. Positive for chest pain.  Dermatological: No rash, lesions/masses Respiratory: No cough, dyspnea Urologic: No hematuria, dysuria Abdominal:   No nausea, vomiting, diarrhea, bright red blood per rectum, melena, or hematemesis Neurologic:  No visual changes, wkns, changes in mental status. All other systems reviewed and are otherwise negative except as noted above.   Physical Exam:     VS:  BP 124/74   Pulse 61   Ht 5' 8"  (1.727 m)   Wt 201 lb (91.2 kg)   SpO2 98%   BMI 30.56 kg/m    General: Well developed, well nourished Caucasian male appearing in no acute distress. Head: Normocephalic, atraumatic, sclera non-icteric, no xanthomas, nares are without discharge.  Neck: No carotid bruits. JVD not elevated.  Lungs: Respirations regular and unlabored, without wheezes or rales.  Heart: Regular rate and rhythm. No S3 or S4.  No murmur, no rubs, or gallops appreciated. Abdomen: Soft, non-tender, non-distended with normoactive bowel sounds. No hepatomegaly. No rebound/guarding. No obvious abdominal masses. Msk:  Strength and tone  appear normal for age. No joint deformities or effusions. Extremities: No clubbing or cyanosis. No lower extremity edema.  Distal pedal pulses are 2+ bilaterally. Neuro: Alert and oriented X 3. Moves all extremities spontaneously. No focal deficits noted. Psych:  Responds to questions appropriately with a normal affect. Skin: No rashes or lesions noted  Wt Readings from Last 3 Encounters:  07/21/18 201 lb (91.2 kg)  07/06/18 220 lb (99.8 kg)  06/25/18 205 lb 3.2 oz (93.1 kg)     Studies/Labs Reviewed:   EKG:  EKG is not ordered today.   Recent Labs: 01/22/2018: Hemoglobin 14.0; Platelets 190 07/06/2018: ALT 31; BUN 15; Creatinine, Ser 0.78; Potassium 4.0; Sodium 136   Lipid Panel    Component Value Date/Time   CHOL 124 07/06/2018 1141   TRIG 118 07/06/2018 1141   HDL 29 (L) 07/06/2018 1141   CHOLHDL 4.3 07/06/2018 1141   VLDL 24 07/06/2018 1141   LDLCALC 71 07/06/2018 1141    Additional studies/ records that were reviewed today include:   NST: 03/2018  There was no ST segment deviation noted during stress.  The study is normal. There are no perfusion defects consistent with prior infarct or current ischemia.  This is a low risk study.  The left ventricular ejection fraction is normal (55-65%).  Cardiac Catheterization:  01/2016 Coronary arteriograms 1. Left main-normal 2. Left anterior descending-normal; D1-normal; D2-normal 3. Left circumflex-normal; OM1-normal; OM 2-normal 4. Right coronary-90% proximal at the takeoff of the RV marginal branch; 70% mid stenosis; PDA-normal  Left ventriculography 1. Normal left ventricular chamber size. 2. Normal wall motion. 3. The estimated EF is 55-60%.   CONCLUSIONS:  1. Single vessel native coronary artery disease. 2. Successful PCI with placement of 2 drug-eluting stents in the proximal and mid RCA. 3. Normal left ventricular chamber size with normal wall motion, the estimated EF is 55-60%.  Assessment:    1. Coronary artery disease involving native coronary artery of native heart with other form of angina pectoris (HCC)   2. Chest pain, unspecified type   3. Essential hypertension   4. Hyperlipidemia LDL goal <70   5. Tobacco use      Plan:   In order of problems listed above:  1. CAD/ Chest Pain - He is s/p DESx2 to RCA in 12/2015 with recent NST in 03/2018 being low risk and showing no evidence of ischemia.  - He does report intermittent episodes of chest discomfort occurring a few times per week which can present at rest or with activity and typically resolves within several minutes. Says this has been occurring since stent placement in 2017. He does not utilize SL NTG regularly given the associated side-effect of headaches. Given no significant ischemia by recent NST and improvement in symptoms with restarting Lopressor, would favor continued medical management at this time. Will start Imdur 30 mg daily. We reviewed that if he develops headaches with this, he could cut the tablet in half and take 15 mg daily. May need to consider a repeat cardiac catheterization in the future for definitive evaluation if symptoms persist despite titration of medical therapy but favor medical therapy for now in the setting of his recent low-risk NST. Remains on ASA  (reduce dosing to 51m daily), BB, and statin therapy.   2. HTN - BP is well controlled at 124/74 during today's visit. Remains on Amlodipine 5 mg daily and Lopressor 25 mg twice daily.  3. HLD - FLP in 07/2018 showed total cholesterol 124, triglycerides 118,  HDL 29, and LDL 71. He had been without his medications for several days leading up to his recent labs. Given he is close to goal of LDL less than 70, would recommend continuing on Atorvastatin 40 mg daily at this time (had myalgias with higher dosing in the past). If remains above goal despite medication compliance, could switch to Crestor 49m daily.   4. Tobacco Use - He continues to smoke 4-5 cigarettes/day. Was recently prescribed nicotine patches by his PCP.   Medication Adjustments/Labs and Tests Ordered: Current medicines are reviewed at length with the patient today.  Concerns regarding medicines are outlined above.  Medication changes, Labs and Tests ordered today are listed in the Patient Instructions below. Patient Instructions  Medication Instructions:  Your physician has recommended you make the following change in your medication:  Decrease Aspirin to 81 mg Daily   If you need a refill on your cardiac medications before your next appointment, please call your pharmacy.   Lab work: NONE   If you have labs (blood work) drawn today and your tests are completely normal, you will receive your results only by: .Marland KitchenMyChart Message (if you have MyChart) OR . A paper copy in the mail If you have any lab test that is abnormal or we need to change your treatment, we will call you to review the results.  Testing/Procedures: NONE  Follow-Up: [facility] you and your health needs are our priority.  As part of our continuing mission to provide you with exceptional heart care, we have created designated Provider Care Teams.  These Care Teams include your primary Cardiologist (physician) and Advanced Practice Providers (APPs  -  Physician Assistants and Nurse Practitioners) who all work together to provide you with the care you need, when you need it. . Your physician recommends that you schedule a follow-up appointment in: 2-3 Months with Dr. BHarl Randolph  Any Other Special Instructions Will Be Listed Below (If Applicable). [facility] you [facility] choosing [facility]     Signed, BErma Heritage PA-C  07/21/2018 5:24 PM    [place]S. [id number] [id number]: [phone]: [phone]

## 2018-07-22 NOTE — Telephone Encounter (Addendum)
I called pt.  He is requesting note from [place] relating to being able to return to work (noting restrictions if any).  Is asking to get from his MDs.  He stated his cardiologist has stated ok per cardiology side to return.  He was originally placed out of work from pcp/MD's in [facility], Dr. Redmond Pulling.  Since living here, his new pcp, Soyla Dryer PA deferred to [place] and Cardiology.  Psych [facility]- psychiatry) does not get involved in this per there policy) he was told.  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  Has appt next Wednesday with  vocational rehab.  Has an appt next 10/20/2018 with [place].  (repeat CTA  In 08/2018). He stated that his headaches are bad.  Been off levetiracetam since 05/2018.  Has had one episode of space out since.   Please advise.

## 2018-07-23 NOTE — Telephone Encounter (Signed)
I called pt and relayed that per Dr. Leta Baptist : since he has psychiatry diagnoses, Dr. Leta Baptist would not get involved in his return to work status. -VRP.  Pt verbalized understanding.

## 2018-09-24 DIAGNOSIS — Z0271 Encounter for disability determination: Secondary | ICD-10-CM

## 2018-09-28 ENCOUNTER — Encounter: Payer: Self-pay | Admitting: *Deleted

## 2018-09-28 ENCOUNTER — Telehealth (INDEPENDENT_AMBULATORY_CARE_PROVIDER_SITE_OTHER): Payer: Self-pay | Admitting: Cardiology

## 2018-09-28 ENCOUNTER — Encounter: Payer: Self-pay | Admitting: Physician Assistant

## 2018-09-28 ENCOUNTER — Ambulatory Visit: Payer: Self-pay | Admitting: Physician Assistant

## 2018-09-28 ENCOUNTER — Telehealth: Payer: Self-pay | Admitting: *Deleted

## 2018-09-28 DIAGNOSIS — F3130 Bipolar disorder, current episode depressed, mild or moderate severity, unspecified: Secondary | ICD-10-CM

## 2018-09-28 DIAGNOSIS — G43109 Migraine with aura, not intractable, without status migrainosus: Secondary | ICD-10-CM

## 2018-09-28 DIAGNOSIS — F603 Borderline personality disorder: Secondary | ICD-10-CM

## 2018-09-28 DIAGNOSIS — I25118 Atherosclerotic heart disease of native coronary artery with other forms of angina pectoris: Secondary | ICD-10-CM

## 2018-09-28 DIAGNOSIS — E785 Hyperlipidemia, unspecified: Secondary | ICD-10-CM

## 2018-09-28 DIAGNOSIS — F17200 Nicotine dependence, unspecified, uncomplicated: Secondary | ICD-10-CM

## 2018-09-28 DIAGNOSIS — I671 Cerebral aneurysm, nonruptured: Secondary | ICD-10-CM

## 2018-09-28 DIAGNOSIS — I10 Essential (primary) hypertension: Secondary | ICD-10-CM

## 2018-09-28 DIAGNOSIS — I2510 Atherosclerotic heart disease of native coronary artery without angina pectoris: Secondary | ICD-10-CM

## 2018-09-28 NOTE — Telephone Encounter (Signed)
Pt said he is willing to do virtual visit tomorrow, he does not have insurance at this time. He said today is a good day, he does not have a HA.Marland KitchenFYI

## 2018-09-28 NOTE — Telephone Encounter (Signed)
Received my chart: Need to have somthing for migrains they are getting to point where i am in bed more than out of bed have ran out of topamax wich was helping very little but was better than Tylenol wich i am trying that and aleive now which are not working at all please contact me my phone number is [phone] or on here either way will be major help thank you Called patient and LVM advising him I would like to schedule a video visit tomorrow. Requested he call back to discuss, go over details. Advised if he does not want to do that he may let me know via my chart or call.

## 2018-09-28 NOTE — Progress Notes (Signed)
There were no vitals taken for this visit.   Subjective:    Patient ID: Brad Randolph, male    DOB: [birth year], 43 y.o.   MRN: [id number]  HPI: Brad Randolph is a 43 y.o. male presenting on 09/28/2018 for No chief complaint on file.   HPI   This is a telemedicine visit through [employer] due to the coronavirus pandemic.   Pt is still going to [facility] for [facility].  He is also going to [facility].   He says he is trying to move everything to [facility] due to [place] is in [facility] but he says it is a process.    Pt says he is doing okay except for HA.   Sometimes he awakens with HA and sometimes it comes later in the day.   He says APAP doesn't help the HA.  Sometimes he doesn't have a HA.   He says he took topamax for the HA when he had it and says it helped some but didn't make it go away.  He ran out of his topamax.   He has follow up with neurology in April.   He says he has had HA for several years.  He feels like they are more in the past month since he's been out of his topamax.   Notes from his visit with neurology say that pt was intolerant of topamax.  Neurology notes mentioned possible addition of SSRI or amitriptyline if needed.  Pt says he is not sleeping well.  He says he spoke a little bit with MH about that that and was told to exercise.    He reports some depression. When asked about chest pains he says  "not as much" CP.  "Says just once in a blue moon" - says it's a lot better  Pt says he got denied for medicaid  Pt says [facility] won't give him a letter to [facility].   Pt's mental status is the limiting factor to return him to work so he will need to get clearance letter from his psychiatrist.  Pt is still smoking.   Relevant past medical, surgical, family and social history reviewed and updated as indicated. Interim medical history since our last visit reviewed. Allergies and medications reviewed and updated.   Current Outpatient Medications:  .  ACETAMINOPHEN PO, Take by mouth., Disp: , Rfl:   .  amLODipine (NORVASC) 5 MG tablet, Take 1 tablet (5 mg total) by mouth daily., Disp: 90 tablet, Rfl: 3 .  aspirin EC 81 MG tablet, Take 1 tablet (81 mg total) by mouth daily., Disp: 90 tablet, Rfl: 3 .  atorvastatin (LIPITOR) 80 MG tablet, Take 0.5 tablets (40 mg total) by mouth daily., Disp: 45 tablet, Rfl: 3 .  benztropine (COGENTIN) 1 MG tablet, Take 1 mg by mouth 2 (two) times daily., Disp: , Rfl:  .  isosorbide mononitrate (IMDUR) 30 MG 24 hr tablet, Take 1 tablet (30 mg total) by mouth daily., Disp: 90 tablet, Rfl: 3 .  metoprolol tartrate (LOPRESSOR) 25 MG tablet, Take 1 tablet (25 mg total) by mouth 2 (two) times daily., Disp: 180 tablet, Rfl: 1 .  Paliperidone Palmitate ER (INVEGA SUSTENNA) 117 MG/0.75ML SUSY, Inject into the muscle every 30 (thirty) days., Disp: , Rfl:  .  nitroGLYCERIN (NITROSTAT) 0.4 MG SL tablet, Place 1 tablet (0.4 mg total) under the tongue every 5 (five) minutes as needed for chest pain. (Patient not taking: Reported on 09/28/2018), Disp: 25 tablet, Rfl: 1 .  topiramate (  TOPAMAX) 25 MG tablet, Take 25 mg by mouth 2 (two) times daily as needed (Headache)., Disp: , Rfl:     Review of Systems  Per HPI unless specifically indicated above     Objective:    There were no vitals taken for this visit.  Wt Readings from Last 3 Encounters:  07/21/18 201 lb (91.2 kg)  07/06/18 220 lb (99.8 kg)  06/25/18 205 lb 3.2 oz (93.1 kg)    Physical Exam Constitutional:      General: He is not in acute distress.    Appearance: He is not ill-appearing.  HENT:     Head: Normocephalic and atraumatic.  Pulmonary:     Effort: Pulmonary effort is normal. No respiratory distress.  Neurological:     Mental Status: He is alert and oriented to person, place, and time. Mental status is at baseline.  Psychiatric:        Mood and Affect: Mood normal.     Results for orders placed or performed during the hospital encounter of 07/06/18  Hemoglobin A1c  Result Value Ref Range    Hgb A1c MFr Bld 5.2 4.8 - 5.6 %   Mean Plasma Glucose 102.54 mg/dL  Lipid panel  Result Value Ref Range   Cholesterol 124 0 - 200 mg/dL   Triglycerides 118 <150 mg/dL   HDL 29 (L) >40 mg/dL   Total CHOL/HDL Ratio 4.3 RATIO   VLDL 24 0 - 40 mg/dL   LDL Cholesterol 71 0 - 99 mg/dL  Comprehensive metabolic panel  Result Value Ref Range   Sodium 136 135 - 145 mmol/L   Potassium 4.0 3.5 - 5.1 mmol/L   Chloride 105 98 - 111 mmol/L   CO2 23 22 - 32 mmol/L   Glucose, Bld 108 (H) 70 - 99 mg/dL   BUN 15 6 - 20 mg/dL   Creatinine, Ser 0.78 0.61 - 1.24 mg/dL   Calcium 9.3 8.9 - 10.3 mg/dL   Total Protein 7.6 6.5 - 8.1 g/dL   Albumin 4.1 3.5 - 5.0 g/dL   AST 22 15 - 41 U/L   ALT 31 0 - 44 U/L   Alkaline Phosphatase 56 38 - 126 U/L   Total Bilirubin 1.8 (H) 0.3 - 1.2 mg/dL   GFR calc non Af Amer >60 >60 mL/min   GFR calc Af Amer >60 >60 mL/min   Anion gap 8 5 - 15      Assessment & Plan:    Encounter Diagnoses  Name Primary?  . Migraine with aura and without status migrainosus, not intractable Yes  . Essential hypertension   . Tobacco use disorder   . 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 Borderline personality disorder ([place])   . Bipolar affective disorder, current episode depressed, current episode severity unspecified ([place])   . Intracranial aneurysm      -Pt to call neurologist about his HA. -pt to continue current medications -will defer labs at this time due to coronavirus.  Labs good January 2020 -pt to continue with mental health specialists -counseled smoking cessation with pt.  Discussed that stopping now will improve overall health and that continuing to smoke will increase his risks were he to get CV19.   -pt is due for repeat CTA head for aneurysm.  Will need to be scheduled when able to  -pt will follow up in 3 months.  He will contact office sooner prn

## 2018-09-28 NOTE — Progress Notes (Signed)
Virtual Visit via Telephone Note    Evaluation Performed:  Follow-up visit  This visit type was conducted due to national recommendations for restrictions regarding the COVID-19 Pandemic (e.g. social distancing).  This format is felt to be most appropriate for this patient at this time.  All issues noted in this document were discussed and addressed.  No physical exam was performed (except for noted visual exam findings with Video Visits).  Please refer to the patient's chart (MyChart message for video visits and phone note for telephone visits) for the patient's consent to telehealth for [facility].  Date:  09/28/2018   ID:  Brad Randolph, DOB 1976/01/10, MRN [id number]  Patient Location:  [place], [place]  Provider location:   [place], [place]  PCP:  Soyla Dryer, PA-C  Cardiologist:  Carlyle Dolly, MD  Electrophysiologist:  None   Chief Complaint:  Chest pain  History of Present Illness:    Brad Randolph is a 43 y.o. male who presents via audio/video conferencing for a telehealth visit today.  Seen today for follow up of the following medical problems.   The patient does not symptoms concerning for COVID-19 infection (fever, chills, cough, or new shortness of breath).     1. Chest pain -12/2015 admitted to [facility] with NSTEMI, received DES x 2 to RCA  - 08/2017 echo LVEF 55-60% - 03/2017 nuclear stress test [facility]  - has had recent chest pain. Ongoing x 1 month. Pressure midchest, can occur at rest or with activity. Significant pain, some increased SOB. Unable to describe duration. Better with deep breathing. Not related to food. Occurs about once a week, last episode was exertional - occasional SOB with activities. Pain is better with nitroglycerin.  - always with some angina he reports.  - currently on ASA and clopidogrel, I think due to possible CVA but unclear.   03/2018 nuclear stress test: no ischemia   - last visit imdur was started for  intermittent chest pain - symptoms much improved since last visit. Occasional headaches since starting imdur - compliant with meds   2. HTN - home bp's have done well, 120s/60-70s   3. Hyperlipidemia  Jan 2020 TC 124 TG 118 HDL 29 LDL 71 He is compliant with statin  Prior CV studies:   The following studies were reviewed today:  08/2017 echo SUMMARY The left ventricular size is normal.  Left ventricular systolic function is normal. LV ejection fraction = 55-60%.  Left ventricular filling pattern is normal. The right ventricle is normal in size and function. There is no significant valvular stenosis or regurgitation IVC size was mildly dilated. There is no pericardial effusion. There is no significant change in comparison with the last study.  01/2016 cath RESULTS:   Coronary arteriograms 1. Left main-normal 2. Left anterior descending-normal; D1-normal; D2-normal 3. Left circumflex-normal; OM1-normal; OM 2-normal 4. Right coronary-90% proximal at the takeoff of the RV marginal Daleigh Pollinger; 70% mid stenosis; PDA-normal  Left ventriculography 1. Normal left ventricular chamber size. 2. Normal wall motion. 3. The estimated EF is 55-60%.   CONCLUSIONS:  1. Single vessel native coronary artery disease. 2. Successful PCI with placement of 2 drug-eluting stents in the proximal and mid RCA. 3. Normal left ventricular chamber size with normal wall motion, the estimated EF is 55-60%.     03/2018 nuclear stress  There was no ST segment deviation noted during stress.  The study is normal. There are no perfusion defects consistent with prior infarct or current ischemia.  This  is a low risk study.  The left ventricular ejection fraction is normal (55-65%).  Past Medical History:  Diagnosis Date  . Anxiety   . Asthma    chilldhood  . Bipolar disorder ([place])   . Borderline personality disorder ([place])   . CAD (coronary artery disease)    a. s/p DESx2 to RCA in 12/2015 b.  low-risk NST in 03/2018  . Depression   . Heart attack ([place])   . Hyperlipidemia   . Hypertension   . Migraine   . Seizures ([place]) 2018   last sz 12/2017   Past Surgical History:  Procedure Laterality Date  . CORONARY ANGIOPLASTY WITH STENT PLACEMENT       No outpatient medications have been marked as taking for the 09/28/18 encounter (Appointment) with [name], MD.     Allergies:   Soap; Tramadol; Mushroom extract complex; Other; and Trazodone and nefazodone   Social History   Tobacco Use  . Smoking status: Current Every Day Smoker    Packs/day: 0.25    Years: 33.00    Pack years: 8.25  . Smokeless tobacco: Never Used  Substance Use Topics  . Alcohol use: Not Currently    Frequency: Never    Comment: previously drunk socially  . 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  ROS:   Please see the history of present illness.    All other systems reviewed and are negative.   Labs/Other Tests and Data Reviewed:    Recent Labs: 01/22/2018: Hemoglobin 14.0; Platelets 190 07/06/2018: ALT 31; BUN 15; Creatinine, Ser 0.78; Potassium 4.0; Sodium 136   Recent Lipid Panel Lab Results  Component Value Date/Time   CHOL 124 07/06/2018 11:41 AM   TRIG 118 07/06/2018 11:41 AM   HDL 29 (L) 07/06/2018 11:41 AM   CHOLHDL 4.3 07/06/2018 11:41 AM   LDLCALC 71 07/06/2018 11:41 AM    Wt Readings from Last 3 Encounters:  07/21/18 201 lb (91.2 kg)  07/06/18 220 lb (99.8 kg)  06/25/18 205 lb 3.2 oz (93.1 kg)     Exam:    Vital Signs:  There were no vitals taken for this visit.   Normal  affect, conversational and pleasant/comfortable  ASSESSMENT & PLAN:    1. CAD with other forms of angina pectoris - recent low risk nuclear stress test without significant symptoms - prior chest pain much improved on imdur. Some headaches on medication, he will try taking 80m bid as opposed to 330mqday   2. HTN - at goal, continue current meds   3. Hyperlipidemia - essentially at goal, continue staitn.   COVID-19 Education: The signs and symptoms of COVID-19 were discussed with the patient and how to seek care for testing (follow up with PCP or arrange E-visit).  The importance of social distancing was discussed today.  Patient Risk:   After full review of this patients clinical status, I feel that they are at least moderate risk at this time.  Time:   Today, I have spent 20 minutes with the patient with telehealth technology discussing CAD/chest pain, HTN, and hyperliidemia.     Medication Adjustments/Labs and Tests  Ordered: Current medicines are reviewed at length with the patient today.  Concerns regarding medicines are outlined above.  Tests Ordered: No orders of the defined types were placed in this encounter.  Medication Changes: No orders of the defined types were placed in this encounter.   Disposition:  Follow up 4 months  Signed, Carlyle Dolly, MD  09/28/2018 10:36 AM    [place]

## 2018-09-28 NOTE — Progress Notes (Signed)
Medication Instructions:  Your physician recommends that you continue on your current medications as directed. Please refer to the Current Medication list given to you today.   Labwork: None  Testing/Procedures: None  Follow-Up: Your physician recommends that you schedule a follow-up appointment in: 4 months    Any Other Special Instructions Will Be Listed Below (If Applicable).     If you need a refill on your cardiac medications before your next appointment, please call your pharmacy.

## 2018-09-28 NOTE — Telephone Encounter (Signed)
Called patient and he understands that although there may be some limitations with this type of visit, we will take all precautions to reduce any security or privacy concerns.  Pt understands that this will be treated like an in office visit and we will bill him for visit. He does not have insurance. Pt's email is [email or web address]. Pt understands that the [employer] webex software must be downloaded and operational on the device pt plans to use for the visit. Reviewed all history, meds, allergies. Patient verbalized understanding, appreciation.

## 2018-09-29 ENCOUNTER — Telehealth: Payer: Self-pay | Admitting: *Deleted

## 2018-09-29 ENCOUNTER — Encounter: Payer: Self-pay | Admitting: Diagnostic Neuroimaging

## 2018-09-29 ENCOUNTER — Other Ambulatory Visit: Payer: Self-pay

## 2018-09-29 ENCOUNTER — Ambulatory Visit (INDEPENDENT_AMBULATORY_CARE_PROVIDER_SITE_OTHER): Payer: Self-pay | Admitting: Diagnostic Neuroimaging

## 2018-09-29 ENCOUNTER — Telehealth: Payer: Self-pay | Admitting: Diagnostic Neuroimaging

## 2018-09-29 DIAGNOSIS — I671 Cerebral aneurysm, nonruptured: Secondary | ICD-10-CM

## 2018-09-29 DIAGNOSIS — R569 Unspecified convulsions: Secondary | ICD-10-CM

## 2018-09-29 DIAGNOSIS — R9089 Other abnormal findings on diagnostic imaging of central nervous system: Secondary | ICD-10-CM

## 2018-09-29 DIAGNOSIS — G43109 Migraine with aura, not intractable, without status migrainosus: Secondary | ICD-10-CM

## 2018-09-29 MED ORDER — GABAPENTIN 300 MG PO CAPS: 300.0000 mg | ORAL_CAPSULE | Freq: Two times a day (BID) | ORAL | 12 refills | Status: DC

## 2018-09-29 NOTE — Progress Notes (Signed)
[facility]  PATIENT: Brad Randolph DOB: [birth year]  REFERRING CLINICIAN: Soyla Dryer, Pa-c [place], [place] [address]  HISTORY FROM: patient  REASON FOR VISIT: follow up (video)   HISTORICAL  CHIEF COMPLAINT:  Chief Complaint  Patient presents with  . Headache    HISTORY OF PRESENT ILLNESS:   UPDATE (09/29/18, VRP): Since last visit, was ok until Feb 2020. HA are worsening in freql similar type. Severity is moderate. No alleviating or aggravating factors. MEDICAID was denied. No other spells.   PRIOR HPI (04/17/18): 43 year old male here for evaluation of headache, aneurysm, memory problems, speech problems, seizure.  Regarding headaches patient has had intermittent sharp and throbbing headaches since age 19 years old with nausea, photophobia, blurred vision.  Patient now having headaches almost on daily basis.  Strong family history of migraine headaches.  Patient is never officially been diagnosed with migraine headaches but he has tried topiramate and Tylenol in the past without relief.  Regarding aneurysm, patient was evaluated in [place] for possible seizure or stroke, had CTA of the head which showed 1.5 mm and 2.5 mm hypophyseal aneurysms.  These were unruptured.  No evidence of stroke.  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  However patient was evaluated in [place] in the last year and apparently was started on levetiracetam.  Patient continues to take levetiracetam 250 mg twice a day.  Patient also having trouble with concentration, thinking, speech and language.  These have been evaluated in the past with MRI and neurologic consultation, and patient was diagnosed with nonorganic/functional/psychogenic etiology of symptoms.  She does have history of prior traumatic childhood, head injury, bipolar  disorder, borderline personality disorder.   REVIEW OF SYSTEMS: Full 14 system review of systems performed and negative with exception of: as per HPI.   ALLERGIES: Allergies  Allergen Reactions  . Soap Rash    Dial soap peels skin  Dial soap peels skin  Dial soap peels skin    . Tramadol Other (See Comments)    "mean" and aggressive per pt "mean" and aggressive per pt "mean" and aggressive per pt aggression    . Mushroom Extract Complex Diarrhea and Nausea And Vomiting  . Other Hives    Dial Soap. - breaks out and blisters  . Trazodone And Nefazodone Other (See Comments)    Violent    HOME MEDICATIONS: Outpatient Medications Prior to Visit  Medication Sig Dispense Refill  . ACETAMINOPHEN PO Take by mouth.    Marland Kitchen amLODipine (NORVASC) 5 MG tablet Take 1 tablet (5 mg total) by mouth daily. 90 tablet 3  . aspirin EC 81 MG tablet Take 1 tablet (81 mg total) by mouth daily. 90 tablet 3  . atorvastatin (LIPITOR) 80 MG tablet Take 0.5 tablets (40 mg total) by mouth daily. 45 tablet 3  . benztropine (COGENTIN) 1 MG tablet Take 1 mg by mouth 2 (two) times daily.    . isosorbide mononitrate (IMDUR) 30 MG 24 hr tablet Take 1 tablet (30 mg total) by mouth daily. 90 tablet 3  . metoprolol tartrate (LOPRESSOR) 25 MG tablet Take 1 tablet (25 mg total) by mouth 2 (two) times daily. 180 tablet 1  . nitroGLYCERIN (NITROSTAT) 0.4 MG SL tablet Place 1 tablet (0.4 mg total) under the tongue every 5 (five) minutes as needed for chest pain. 25 tablet 1  . Paliperidone Palmitate ER (  INVEGA SUSTENNA) 117 MG/0.75ML SUSY Inject into the muscle every 30 (thirty) days.    Marland Kitchen topiramate (TOPAMAX) 25 MG tablet Take 25 mg by mouth 2 (two) times daily as needed (Headache).     No facility-administered medications prior to visit.      PHYSICAL EXAM  GENERAL EXAM/CONSTITUTIONAL: Vitals:  There were no vitals filed for this visit. There is no height or weight on file to calculate BMI. Wt Readings from  Last 3 Encounters:  07/21/18 201 lb (91.2 kg)  07/06/18 220 lb (99.8 kg)  06/25/18 205 lb 3.2 oz (93.1 kg)    Patient is in no distress; well developed, nourished and groomed; neck is supple  NEUROLOGIC: MENTAL STATUS:  No flowsheet data found.  awake, alert, oriented to person, place and time  recent and remote memory intact  normal attention and concentration  STUTTERING, SLOW SPEECH; COMPREHENSION INTACT  fund of knowledge appropriate   DIAGNOSTIC DATA (LABS, IMAGING, TESTING) - I reviewed patient records, labs, notes, testing and imaging myself where available.  Lab Results  Component Value Date   WBC 10.8 (H) 01/22/2018   HGB 14.0 01/22/2018   HCT 41.7 01/22/2018   MCV 92.1 01/22/2018   PLT 190 01/22/2018      Component Value Date/Time   NA 136 07/06/2018 1142   K 4.0 07/06/2018 1142   CL 105 07/06/2018 1142   CO2 23 07/06/2018 1142   GLUCOSE 108 (H) 07/06/2018 1142   BUN 15 07/06/2018 1142   CREATININE 0.78 07/06/2018 1142   CALCIUM 9.3 07/06/2018 1142   PROT 7.6 07/06/2018 1142   ALBUMIN 4.1 07/06/2018 1142   AST 22 07/06/2018 1142   ALT 31 07/06/2018 1142   ALKPHOS 56 07/06/2018 1142   BILITOT 1.8 (H) 07/06/2018 1142   GFRNONAA >60 07/06/2018 1142   GFRAA >60 07/06/2018 1142   Lab Results  Component Value Date   CHOL 124 07/06/2018   HDL 29 (L) 07/06/2018   LDLCALC 71 07/06/2018   TRIG 118 07/06/2018   CHOLHDL 4.3 07/06/2018   Lab Results  Component Value Date   HGBA1C 5.2 07/06/2018   No results found for: VITAMINB12 No results found for: TSH   09/01/17 MRI brain w/wo / MRA head / MRA neck 1. No acute intracranial abnormality identified. 2. No acute vascular abnormality.  09/10/17 CTA head / neck [report only] - ? 2.21m hypophyseal aneurysm on right; possible 1.560maneurysm on left    ASSESSMENT AND PLAN  43.0. year old male here with:  Dx:  No diagnosis found.    Virtual Visit via Video Note  I connected with CoCarmelina Pealn 09/29/18 at 11:30 AM EDT by a video enabled telemedicine application and verified that I am speaking with the correct person using two identifiers.   I discussed the limitations of evaluation and management by telemedicine and the availability of in person appointments. The patient expressed understanding and agreed to proceed.   I discussed the assessment and treatment plan with the patient. The patient was provided an opportunity to ask questions and all were answered. The patient agreed with the plan and demonstrated an understanding of the instructions.   The patient was advised to call back or seek an in-person evaluation if the symptoms worsen or if the condition fails to improve as anticipated.  I provided 15 minutes of non-face-to-face time during this encounter.    PLAN:   MIGRAINE WITH AURA - will try gabapentin 3004mt bedtime x 2  weeks; then increase to twice a day for migraine prevention - may consider CGRP antagonists in future (but has financial limitations) - tylenol as needed for migraine rescue - cannot use triptan due to CAD (h/o NSTEMI)  BRAIN ANEURYSM (asymptomatic, unruptured; ? 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 gabapentin (NEURONTIN) 300 MG capsule    Sig: Take 1 capsule (300 mg total) by mouth 2 (two) times daily.    Dispense:  60 capsule    Refill:  12   Orders Placed This Encounter  Procedures  . CT ANGIO HEAD W OR WO CONTRAST   Return in about 6 months (around  03/31/2019).    VIPenni BombardMD 09/06/34/[id number]:54M Certified in Neurology, Neurophysiology and Neuroimaging  [facility] [address] [place] [phone]

## 2018-09-29 NOTE — Telephone Encounter (Signed)
self pay order sent to [facility] . They will reach out to the pt to schedule.

## 2018-09-29 NOTE — Telephone Encounter (Signed)
Return in about 6 months (around 03/31/2019).

## 2018-09-30 ENCOUNTER — Telehealth: Payer: Self-pay | Admitting: Cardiology

## 2018-10-05 ENCOUNTER — Telehealth: Payer: Self-pay | Admitting: *Deleted

## 2018-10-05 MED ORDER — GABAPENTIN 300 MG PO CAPS: 300.0000 mg | ORAL_CAPSULE | Freq: Two times a day (BID) | ORAL | 12 refills | Status: DC

## 2018-10-05 NOTE — Telephone Encounter (Signed)
Called patient and advised of Dr Hilbert Corrigan reply. He stated that he has no insurance. We discussed CGRP, but he was reluctant to try an injection.  He requested the gabapentin Rx be sent to [place], [place]. He stated he may be able to afford it there.  Rx sent to [facility] as requested.

## 2018-10-05 NOTE — Telephone Encounter (Signed)
Received my chart : My pharmacy dosnt carry the gabipentin, is there another medicen we can try they might carry. Called [facility], spoke with Junie Panning who stated they do not carry gabapentin or Neurontin. For migraine preventative they only carry topamax. Will advise Dr Leta Baptist.

## 2018-10-05 NOTE — Telephone Encounter (Signed)
Patient tried and failed topiramate. Cannot use BP meds as patient not monitoring BP at home. Consider aimovig / ajovy. -VRP

## 2018-10-20 ENCOUNTER — Telehealth: Payer: Self-pay | Admitting: Diagnostic Neuroimaging

## 2018-12-22 ENCOUNTER — Other Ambulatory Visit: Payer: Self-pay | Admitting: Physician Assistant

## 2018-12-22 ENCOUNTER — Encounter: Payer: Self-pay | Admitting: Physician Assistant

## 2018-12-22 MED ORDER — METOPROLOL TARTRATE 25 MG PO TABS: 25.0000 mg | ORAL_TABLET | Freq: Two times a day (BID) | ORAL | 0 refills | Status: DC

## 2018-12-28 ENCOUNTER — Other Ambulatory Visit: Payer: Self-pay | Admitting: Physician Assistant

## 2018-12-28 DIAGNOSIS — I2510 Atherosclerotic heart disease of native coronary artery without angina pectoris: Secondary | ICD-10-CM

## 2018-12-28 DIAGNOSIS — I10 Essential (primary) hypertension: Secondary | ICD-10-CM

## 2019-01-04 ENCOUNTER — Ambulatory Visit: Payer: Self-pay | Admitting: Physician Assistant

## 2019-01-04 ENCOUNTER — Other Ambulatory Visit: Payer: Self-pay

## 2019-01-04 ENCOUNTER — Encounter: Payer: Self-pay | Admitting: Physician Assistant

## 2019-01-04 ENCOUNTER — Other Ambulatory Visit (HOSPITAL_COMMUNITY)
Admission: RE | Admit: 2019-01-04 | Discharge: 2019-01-04 | Disposition: A | Discharge status: Home / Self Care | Payer: Self-pay | Source: Ambulatory Visit | Attending: Physician Assistant | Admitting: Physician Assistant

## 2019-01-04 VITALS — BP 98/62 | HR 83 | Temp 98.1°F | Wt 200.4 lb

## 2019-01-04 DIAGNOSIS — I2510 Atherosclerotic heart disease of native coronary artery without angina pectoris: Secondary | ICD-10-CM | POA: Insufficient documentation

## 2019-01-04 DIAGNOSIS — F3130 Bipolar disorder, current episode depressed, mild or moderate severity, unspecified: Secondary | ICD-10-CM

## 2019-01-04 DIAGNOSIS — R062 Wheezing: Secondary | ICD-10-CM

## 2019-01-04 DIAGNOSIS — I671 Cerebral aneurysm, nonruptured: Secondary | ICD-10-CM

## 2019-01-04 DIAGNOSIS — I10 Essential (primary) hypertension: Secondary | ICD-10-CM | POA: Insufficient documentation

## 2019-01-04 DIAGNOSIS — G43109 Migraine with aura, not intractable, without status migrainosus: Secondary | ICD-10-CM

## 2019-01-04 DIAGNOSIS — F17200 Nicotine dependence, unspecified, uncomplicated: Secondary | ICD-10-CM

## 2019-01-04 DIAGNOSIS — F603 Borderline personality disorder: Secondary | ICD-10-CM

## 2019-01-04 LAB — COMPREHENSIVE METABOLIC PANEL
ALT: 41 U/L (ref 0–44)
AST: 26 U/L (ref 15–41)
Albumin: 4 g/dL (ref 3.5–5.0)
Alkaline Phosphatase: 56 U/L (ref 38–126)
Anion gap: 8 (ref 5–15)
BUN: 14 mg/dL (ref 6–20)
CO2: 26 mmol/L (ref 22–32)
Calcium: 9.5 mg/dL (ref 8.9–10.3)
Chloride: 104 mmol/L (ref 98–111)
Creatinine, Ser: 0.87 mg/dL (ref 0.61–1.24)
GFR calc Af Amer: >60 mL/min (ref >60)
GFR calc non Af Amer: >60 mL/min (ref >60)
Glucose, Bld: 112 mg/dL — ABNORMAL HIGH (ref 70–99)
Potassium: 4.5 mmol/L (ref 3.5–5.1)
Sodium: 138 mmol/L (ref 135–145)
Total Bilirubin: 1.2 mg/dL (ref 0.3–1.2)
Total Protein: 7.5 g/dL (ref 6.5–8.1)

## 2019-01-04 LAB — LIPID PANEL
Cholesterol: 113 mg/dL (ref 0–200)
HDL: 30 mg/dL — ABNORMAL LOW (ref >40)
LDL Cholesterol: 65 mg/dL (ref 0–99)
Total CHOL/HDL Ratio: 3.8 RATIO
Triglycerides: 91 mg/dL (ref <150)
VLDL: 18 mg/dL (ref 0–40)

## 2019-01-04 MED ORDER — ALBUTEROL SULFATE HFA 108 (90 BASE) MCG/ACT IN AERS: 2.0000 | INHALATION_SPRAY | Freq: Four times a day (QID) | RESPIRATORY_TRACT | 1 refills | Status: DC | PRN

## 2019-01-04 NOTE — Progress Notes (Signed)
BP 98/62   Pulse 83   Temp 98.1 F (36.7 C)   Wt 200 lb 6.4 oz (90.9 kg)   SpO2 97%   BMI 30.47 kg/m    Subjective:    Patient ID: Brad Randolph, male    DOB: [birth year], 43 y.o.   MRN: [id number]  HPI: Brad Randolph is a 43 y.o. male presenting on 01/04/2019 for No chief complaint on file.   HPI   Pt had negative screeningquestionnairefor CV19   Pt had appointment with neurology in March.  CTA ordered but not done or even scheduled yet.  He has follow up with neurology in October  Pt had appointment with cardiology in March.  He has follow up with cardiology in august.    Pt didn't get his labs drawn  Pt is still going to [place] for mental health issues  He says he is doing okay.  His only issue is HA.  The HA is being managed by neurology  He says he is wearing a mask when he goes out.  He is exercising, mostly with doing yardwork.   He is still smoking.   Pt is in process of getting vocational rehab.   He says he is eager to return to work and hopes the vocational rehab with help with that.   Relevant past medical, surgical, family and social history reviewed and updated as indicated. Interim medical history since our last visit reviewed. Allergies and medications reviewed and updated.   Current Outpatient Medications:  .  ACETAMINOPHEN PO, Take by mouth., Disp: , Rfl:  .  amLODipine (NORVASC) 5 MG tablet, Take 1 tablet (5 mg total) by mouth daily., Disp: 90 tablet, Rfl: 3 .  aspirin EC 81 MG tablet, Take 1 tablet (81 mg total) by mouth daily., Disp: 90 tablet, Rfl: 3 .  atorvastatin (LIPITOR) 80 MG tablet, Take 0.5 tablets (40 mg total) by mouth daily., Disp: 45 tablet, Rfl: 3 .  gabapentin (NEURONTIN) 300 MG capsule, Take 1 capsule (300 mg total) by mouth 2 (two) times daily., Disp: 60 capsule, Rfl: 12 .  hydrOXYzine (VISTARIL) 25 MG capsule, Take 25 mg by mouth daily as needed for anxiety., Disp: , Rfl:  .  isosorbide mononitrate (IMDUR) 30 MG 24 hr tablet, Take 1  tablet (30 mg total) by mouth daily., Disp: 90 tablet, Rfl: 3 .  metoprolol tartrate (LOPRESSOR) 25 MG tablet, Take 1 tablet (25 mg total) by mouth 2 (two) times daily., Disp: 180 tablet, Rfl: 0 .  nitroGLYCERIN (NITROSTAT) 0.4 MG SL tablet, Place 1 tablet (0.4 mg total) under the tongue every 5 (five) minutes as needed for chest pain., Disp: 25 tablet, Rfl: 1 .  Paliperidone Palmitate ER (INVEGA SUSTENNA) 117 MG/0.75ML SUSY, Inject into the muscle every 30 (thirty) days., Disp: , Rfl:  .  benztropine (COGENTIN) 1 MG tablet, Take 1 mg by mouth 2 (two) times daily., Disp: , Rfl:  .  topiramate (TOPAMAX) 25 MG tablet, Take 25 mg by mouth 2 (two) times daily as needed (Headache)., Disp: , Rfl:      Review of Systems  Per HPI unless specifically indicated above     Objective:    BP 98/62   Pulse 83   Temp 98.1 F (36.7 C)   Wt 200 lb 6.4 oz (90.9 kg)   SpO2 97%   BMI 30.47 kg/m   Wt Readings from Last 3 Encounters:  01/04/19 200 lb 6.4 oz (90.9 kg)  07/21/18 201 lb (91.2 kg)  07/06/18 220 lb (99.8 kg)    Physical Exam Vitals signs reviewed.  Constitutional:      Appearance: He is well-developed.  HENT:     Head: Normocephalic and atraumatic.  Neck:     Musculoskeletal: Neck supple.  Cardiovascular:     Rate and Rhythm: Normal rate and regular rhythm.  Pulmonary:     Effort: Pulmonary effort is normal. No tachypnea or respiratory distress.     Breath sounds: Wheezing present. No rhonchi or rales.  Abdominal:     General: Bowel sounds are normal.     Palpations: Abdomen is soft.     Tenderness: There is no abdominal tenderness.  Musculoskeletal:     Right lower leg: No edema.     Left lower leg: No edema.  Lymphadenopathy:     Cervical: No cervical adenopathy.  Skin:    General: Skin is warm and dry.  Neurological:     Mental Status: He is alert and oriented to person, place, and time.  Psychiatric:        Attention and Perception: Attention normal.         Behavior: Behavior is cooperative.           Assessment & Plan:     Encounter Diagnoses  Name Primary?  . Essential hypertension Yes  . 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 Tobacco use disorder   . Wheezing   . Borderline personality disorder ([place])   . Bipolar affective disorder, current episode depressed, current episode severity unspecified ([place])   . Migraine with aura and without status migrainosus, not intractable   . Intracranial aneurysm     -pt Get fasting labs- will call results  -will Add albuterol inhaler.  He says he knows how to use these as he has had them in the past. Encouraged smoking cessation to help reduce the wheezes as well  -Pt is encouraged to call financial counselor to check on his [employer] application  -encouraged pt to Continue to wear a mask when in public  -pt to continue with cardiology and neurology per their recommendations  -CTA was scheduled and pt was given the information  -pt to follow up in 4 months.  He is to contact office sooner prn

## 2019-01-04 NOTE — Patient Instructions (Addendum)
Financial Counselor- [phone]  (call to check on [employer] financial assistance)  LIQUIDS ONLY [address] 22 FOR CT

## 2019-01-11 ENCOUNTER — Encounter: Payer: Self-pay | Admitting: Physician Assistant

## 2019-01-11 ENCOUNTER — Ambulatory Visit: Payer: Self-pay | Admitting: Physician Assistant

## 2019-01-11 ENCOUNTER — Other Ambulatory Visit: Payer: Self-pay

## 2019-01-11 DIAGNOSIS — K0889 Other specified disorders of teeth and supporting structures: Secondary | ICD-10-CM

## 2019-01-11 DIAGNOSIS — Z20822 Contact with and (suspected) exposure to covid-19: Secondary | ICD-10-CM

## 2019-01-11 MED ORDER — AMOXICILLIN 500 MG PO CAPS: 500.0000 mg | ORAL_CAPSULE | Freq: Three times a day (TID) | ORAL | 0 refills | Status: DC

## 2019-01-11 NOTE — Progress Notes (Signed)
[place]

## 2019-01-11 NOTE — Progress Notes (Signed)
There were no vitals taken for this visit.   Subjective:    Patient ID: Brad Randolph, male    DOB: 06-30-1976, 43 y.o.   MRN: [id number]  HPI: Brad Randolph is a 43 y.o. male presenting on 01/11/2019 for No chief complaint on file.   HPI   This is a telemedicine appointment through [employer] due to coronavirus pandemic  I connected with  Brad Randolph on 01/11/19 by a video enabled telemedicine application and verified that I am speaking with the correct person using two identifiers.   I discussed the limitations of evaluation and management by telemedicine. The patient expressed understanding and agreed to proceed.   Pt is at home.  Provider is at office     Pt complains of dental infection.  He began having toothache last week.  This morning he noticed some swelling of his face.  He denies fever.  Pt with long history of poor dentition with significant decay.    Relevant past medical, surgical, family and social history reviewed and updated as indicated. Interim medical history since our last visit reviewed. Allergies and medications reviewed and updated.   Current Outpatient Medications:  .  ACETAMINOPHEN PO, Take by mouth., Disp: , Rfl:  .  albuterol (VENTOLIN HFA) 108 (90 Base) MCG/ACT inhaler, Inhale 2 puffs into the lungs every 6 (six) hours as needed for wheezing or shortness of breath., Disp: 21 g, Rfl: 1 .  amLODipine (NORVASC) 5 MG tablet, Take 1 tablet (5 mg total) by mouth daily., Disp: 90 tablet, Rfl: 3 .  aspirin EC 81 MG tablet, Take 1 tablet (81 mg total) by mouth daily., Disp: 90 tablet, Rfl: 3 .  atorvastatin (LIPITOR) 80 MG tablet, Take 0.5 tablets (40 mg total) by mouth daily., Disp: 45 tablet, Rfl: 3 .  gabapentin (NEURONTIN) 300 MG capsule, Take 1 capsule (300 mg total) by mouth 2 (two) times daily., Disp: 60 capsule, Rfl: 12 .  hydrOXYzine (VISTARIL) 25 MG capsule, Take 25 mg by mouth daily as needed for anxiety., Disp: , Rfl:  .  isosorbide mononitrate (IMDUR)  30 MG 24 hr tablet, Take 1 tablet (30 mg total) by mouth daily., Disp: 90 tablet, Rfl: 3 .  metoprolol tartrate (LOPRESSOR) 25 MG tablet, Take 1 tablet (25 mg total) by mouth 2 (two) times daily., Disp: 180 tablet, Rfl: 0 .  Paliperidone Palmitate ER (INVEGA SUSTENNA) 117 MG/0.75ML SUSY, Inject into the muscle every 30 (thirty) days., Disp: , Rfl:  .  benztropine (COGENTIN) 1 MG tablet, Take 1 mg by mouth 2 (two) times daily., Disp: , Rfl:  .  nitroGLYCERIN (NITROSTAT) 0.4 MG SL tablet, Place 1 tablet (0.4 mg total) under the tongue every 5 (five) minutes as needed for chest pain. (Patient not taking: Reported on 01/11/2019), Disp: 25 tablet, Rfl: 1 .  topiramate (TOPAMAX) 25 MG tablet, Take 25 mg by mouth 2 (two) times daily as needed (Headache)., Disp: , Rfl:    Review of Systems  Per HPI unless specifically indicated above     Objective:    There were no vitals taken for this visit.  Wt Readings from Last 3 Encounters:  01/04/19 200 lb 6.4 oz (90.9 kg)  07/21/18 201 lb (91.2 kg)  07/06/18 220 lb (99.8 kg)    Physical Exam Constitutional:      General: He is not in acute distress.    Appearance: Normal appearance. He is not ill-appearing.  HENT:     Head: Normocephalic and atraumatic.  Mouth/Throat:     Dentition: Abnormal dentition. Dental caries present.     Comments: Extensive dental decay.  Mild swelling of the L side of the face over the upper teeth area.  Pt speaks normally and is not drooling.  No trismus.  Pulmonary:     Effort: Pulmonary effort is normal. No respiratory distress.  Neurological:     Mental Status: He is alert and oriented to person, place, and time.  Psychiatric:        Attention and Perception: Attention normal.        Speech: Speech normal.        Behavior: Behavior is cooperative.        Results for orders placed or performed during the hospital encounter of 01/04/19  Lipid panel  Result Value Ref Range   Cholesterol 113 0 - 200 mg/dL    Triglycerides 91 <150 mg/dL   HDL 30 (L) >40 mg/dL   Total CHOL/HDL Ratio 3.8 RATIO   VLDL 18 0 - 40 mg/dL   LDL Cholesterol 65 0 - 99 mg/dL  Comprehensive metabolic panel  Result Value Ref Range   Sodium 138 135 - 145 mmol/L   Potassium 4.5 3.5 - 5.1 mmol/L   Chloride 104 98 - 111 mmol/L   CO2 26 22 - 32 mmol/L   Glucose, Bld 112 (H) 70 - 99 mg/dL   BUN 14 6 - 20 mg/dL   Creatinine, Ser 0.87 0.61 - 1.24 mg/dL   Calcium 9.5 8.9 - 10.3 mg/dL   Total Protein 7.5 6.5 - 8.1 g/dL   Albumin 4.0 3.5 - 5.0 g/dL   AST 26 15 - 41 U/L   ALT 41 0 - 44 U/L   Alkaline Phosphatase 56 38 - 126 U/L   Total Bilirubin 1.2 0.3 - 1.2 mg/dL   GFR calc non Af Amer >60 >60 mL/min   GFR calc Af Amer >60 >60 mL/min   Anion gap 8 5 - 15      Assessment & Plan:   Encounter Diagnosis  Name Primary?  Paschal Dopp Yes     -reviewed labs with pt -rx amoxil for the tooth.  Pt is put on dental list -pt to follow up as scheduled.  He is to contact office sooner prn worsening or new symptoms

## 2019-01-14 LAB — NOVEL CORONAVIRUS, NAA: SARS-CoV-2, NAA: NOT DETECTED

## 2019-01-20 ENCOUNTER — Ambulatory Visit (HOSPITAL_COMMUNITY): Payer: Self-pay

## 2019-01-28 ENCOUNTER — Telehealth: Payer: Self-pay | Admitting: Cardiology

## 2019-01-28 NOTE — Telephone Encounter (Signed)
Patient has scale but no bp cuff   YOUR CARDIOLOGY TEAM HAS ARRANGED FOR AN E-VISIT FOR YOUR APPOINTMENT - PLEASE REVIEW IMPORTANT INFORMATION BELOW SEVERAL DAYS PRIOR TO YOUR APPOINTMENT  Due to the recent COVID-19 pandemic, we are transitioning in-person office visits to tele-medicine visits in an effort to decrease unnecessary exposure to our patients, their families, and staff. These visits are billed to your insurance just like a normal visit is. We also encourage you to sign up for MyChart if you have not already done so. You will need a smartphone if possible. For patients that do not have this, we can still complete the visit using a regular telephone but do prefer a smartphone to enable video when possible. You may have a family member that lives with you that can help. If possible, we also ask that you have a blood pressure cuff and scale at home to measure your blood pressure, heart rate and weight prior to your scheduled appointment. Patients with clinical needs that need an in-person evaluation and testing will still be able to come to the office if absolutely necessary. If you have any questions, feel free to call our office.     YOUR PROVIDER WILL BE USING THE FOLLOWING PLATFORM TO COMPLETE YOUR VISIT: yes   IF USING MYCHART - How to Download the MyChart App to Your SmartPhone   - If Apple, go to [employer] and type in MyChart in the search bar and download the app. If Android, ask patient to go to [employer] and type in [place] in the search bar and download the app. The app is free but as with any other app downloads, your phone may require you to verify saved payment information or Apple/Android password.  - You will need to then log into the app with your MyChart username and password, and select [facility] as your healthcare provider to link the account.  - When it is time for your visit, go to the MyChart app, find appointments, and click Begin Video Visit. Be sure to  Select Allow for your device to access the Microphone and Camera for your visit. You will then be connected, and your provider will be with you shortly.  **If you have any issues connecting or need assistance, please contact MyChart service desk [phone])**  **If using a computer, in order to ensure the best quality for your visit, you will need to use either of the following Internet Browsers: Insurance underwriter or Microsoft Edge**   IF USING DOXIMITY or [email or web address] - The staff will give you instructions on receiving your link to join the meeting the day of your visit.      2-3 DAYS BEFORE YOUR APPOINTMENT  You will receive a telephone call from one of our [facility] team members - your caller ID may say "Unknown caller." If this is a video visit, we will walk you through how to get the video launched on your phone. We will remind you check your blood pressure, heart rate and weight prior to your scheduled appointment. If you have an Apple Watch or Kardia, please upload any pertinent ECG strips the day before or morning of your appointment to [facility]. Our staff will also make sure you have reviewed the consent and agree to move forward with your scheduled tele-health visit.     THE DAY OF YOUR APPOINTMENT  Approximately 15 minutes prior to your scheduled appointment, you will receive a telephone call from one of [facility] team -  your caller ID may say "Unknown caller."  Our staff will confirm medications, vital signs for the day and any symptoms you may be experiencing. Please have this information available prior to the time of visit start. It may also be helpful for you to have a pad of paper and pen handy for any instructions given during your visit. They will also walk you through joining the smartphone meeting if this is a video visit.    CONSENT FOR TELE-HEALTH VISIT - PLEASE REVIEW  I hereby voluntarily request, consent and authorize [facility] and its employed or  contracted physicians, physician assistants, nurse practitioners or other licensed health care professionals (the Practitioner), to provide me with telemedicine health care services (the Services") as deemed necessary by the treating Practitioner. I acknowledge and consent to receive the Services by the Practitioner via telemedicine. I understand that the telemedicine visit will involve communicating with the Practitioner through live audiovisual communication technology and the disclosure of certain medical information by electronic transmission. I acknowledge that I have been given the opportunity to request an in-person assessment or other available alternative prior to the telemedicine visit and am voluntarily participating in the telemedicine visit.  I understand that I have the right to withhold or withdraw my consent to the use of telemedicine in the course of my care at any time, without affecting my right to future care or treatment, and that the Practitioner or I may terminate the telemedicine visit at any time. I understand that I have the right to inspect all information obtained and/or recorded in the course of the telemedicine visit and may receive copies of available information for a reasonable fee.  I understand that some of the potential risks of receiving the Services via telemedicine include:   Delay or interruption in medical evaluation due to technological equipment failure or disruption;  Information transmitted may not be sufficient (e.g. poor resolution of images) to allow for appropriate medical decision making by the Practitioner; and/or   In rare instances, security protocols could fail, causing a breach of personal health information.  Furthermore, I acknowledge that it is my responsibility to provide information about my medical history, conditions and care that is complete and accurate to the best of my ability. I acknowledge that Practitioner's advice, recommendations,  and/or decision may be based on factors not within their control, such as incomplete or inaccurate data provided by me or distortions of diagnostic images or specimens that may result from electronic transmissions. I understand that the practice of medicine is not an exact science and that Practitioner makes no warranties or guarantees regarding treatment outcomes. I acknowledge that I will receive a copy of this consent concurrently upon execution via email to the email address I last provided but may also request a printed copy by calling the office of [facility].    I understand that my insurance will be billed for this visit.   I have read or had this consent read to me.  I understand the contents of this consent, which adequately explains the benefits and risks of the Services being provided via telemedicine.   I have been provided ample opportunity to ask questions regarding this consent and the Services and have had my questions answered to my satisfaction.  I give my informed consent for the services to be provided through the use of telemedicine in my medical care  By participating in this telemedicine visit I agree to the above.

## 2019-02-01 ENCOUNTER — Telehealth (INDEPENDENT_AMBULATORY_CARE_PROVIDER_SITE_OTHER): Payer: Self-pay | Admitting: Cardiology

## 2019-02-01 ENCOUNTER — Encounter: Payer: Self-pay | Admitting: Cardiology

## 2019-02-01 VITALS — BP 137/88 | HR 80 | Ht 67.0 in | Wt 198.0 lb

## 2019-02-01 DIAGNOSIS — I10 Essential (primary) hypertension: Secondary | ICD-10-CM

## 2019-02-01 DIAGNOSIS — I25118 Atherosclerotic heart disease of native coronary artery with other forms of angina pectoris: Secondary | ICD-10-CM

## 2019-02-01 NOTE — Progress Notes (Signed)
Virtual Visit via Telephone Note   This visit type was conducted due to national recommendations for restrictions regarding the COVID-19 Pandemic (e.g. social distancing) in an effort to limit this patient's exposure and mitigate transmission in our community.  Due to his co-morbid illnesses, this patient is at least at moderate risk for complications without adequate follow up.  This format is felt to be most appropriate for this patient at this time.  The patient did not have access to video technology/had technical difficulties with video requiring transitioning to audio format only (telephone).  All issues noted in this document were discussed and addressed.  No physical exam could be performed with this format.  Please refer to the patient's chart for his  consent to telehealth for [facility].   Date:  02/01/2019   ID:  Brad Randolph, DOB [birth year], MRN [id number]  Patient Location: Home Provider Location: Home  PCP:  Soyla Dryer, PA-C  Cardiologist:  Carlyle Dolly, MD  Electrophysiologist:  None   Evaluation Performed:  Follow-Up Visit  Chief Complaint:  Follow up visit  History of Present Illness:    Brad Randolph is a 43 y.o. male seen today for follow up of the following medical proble.s    1. Chest pain/CAD -12/2015 admitted to [facility] with NSTEMI, received DES x 2 to RCA  - 08/2017 echo LVEF 55-60% 03/2018 nuclear stress test: no ischemia  - has had long history of intermittent chest pain over the last few years - last visit imdur was started for intermittent chest pain - symptoms much improved since last visit. Occasional headaches he reports are chornic and not related to imdur - overall mild infrequent symptoms since last visit.    2. HTN  - home bp's typically 130s/80   3. Hyperlipidemia   12/2018 TC 113 TG 91 HDL 30 LDL 65 - compliant with statin    The patient does not have symptoms concerning for COVID-19 infection (fever, chills,  cough, or new shortness of breath).    Past Medical History:  Diagnosis Date  . Anxiety   . Asthma    chilldhood  . Bipolar disorder ([place])   . Borderline personality disorder ([place])   . CAD (coronary artery disease)    a. s/p DESx2 to RCA in 12/2015 b. low-risk NST in 03/2018  . Depression   . Heart attack ([place])   . Hyperlipidemia   . Hypertension   . Migraine   . Seizures ([place]) 2018   last sz 12/2017   Past Surgical History:  Procedure Laterality Date  . CORONARY ANGIOPLASTY WITH STENT PLACEMENT       Current Meds  Medication Sig  . ACETAMINOPHEN PO Take by mouth.  Marland Kitchen albuterol (VENTOLIN HFA) 108 (90 Base) MCG/ACT inhaler Inhale 2 puffs into the lungs every 6 (six) hours as needed for wheezing or shortness of breath.  Marland Kitchen amLODipine (NORVASC) 5 MG tablet Take 1 tablet (5 mg total) by mouth daily.  Marland Kitchen aspirin EC 81 MG tablet Take 1 tablet (81 mg total) by mouth daily.  Marland Kitchen atorvastatin (LIPITOR) 80 MG tablet Take 0.5 tablets (40 mg total) by mouth daily.  . benztropine (COGENTIN) 1 MG tablet Take 1 mg by mouth 2 (two) times daily.  Marland Kitchen gabapentin (NEURONTIN) 300 MG capsule Take 1 capsule (300 mg total) by mouth 2 (two) times daily.  . hydrOXYzine (VISTARIL) 25 MG capsule Take 25 mg by mouth daily as needed for anxiety.  . isosorbide mononitrate (IMDUR) 30 MG 24  hr tablet Take 1 tablet (30 mg total) by mouth daily.  . metoprolol tartrate (LOPRESSOR) 25 MG tablet Take 1 tablet (25 mg total) by mouth 2 (two) times daily.  . nitroGLYCERIN (NITROSTAT) 0.4 MG SL tablet Place 1 tablet (0.4 mg total) under the tongue every 5 (five) minutes as needed for chest pain.  . Paliperidone Palmitate ER (INVEGA SUSTENNA) 117 MG/0.75ML SUSY Inject into the muscle every 30 (thirty) days.  Marland Kitchen topiramate (TOPAMAX) 25 MG tablet Take 25 mg by mouth 2 (two) times daily as needed (Headache).     Allergies:   Soap, Tramadol, Mushroom extract complex, Other, and Trazodone and nefazodone   Social History    Tobacco Use  . Smoking status: Current Every Day Smoker    Packs/day: 0.25    Years: 33.00    Pack years: 8.25  . Smokeless tobacco: Never Used  Substance Use Topics  . Alcohol use: Not Currently    Frequency: Never    Comment: previously drunk socially  . 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  ROS:   Please see the history of present illness.     All other systems reviewed and are negative.   Prior CV studies:   The following studies were reviewed today:  08/2017 echo SUMMARY The left ventricular size is normal.  Left ventricular systolic function is normal. LV ejection fraction = 55-60%.  Left ventricular filling pattern is normal. The right ventricle is normal in size and function. There is no significant valvular stenosis or regurgitation IVC size was mildly dilated. There is no pericardial effusion. There is no significant change in comparison with the last study.  01/2016 cath RESULTS:   Coronary arteriograms 1. Left main-normal 2. Left anterior descending-normal; D1-normal; D2-normal 3. Left circumflex-normal; OM1-normal; OM 2-normal 4. Right coronary-90% proximal at the takeoff of the RV marginal Leisha Trinkle; 70% mid stenosis; PDA-normal  Left ventriculography 1. Normal left ventricular chamber size. 2. Normal wall motion. 3. The estimated EF is 55-60%.   CONCLUSIONS:  1. Single vessel native coronary artery disease. 2. Successful PCI with placement of 2 drug-eluting stents in the proximal and mid RCA. 3. Normal left ventricular chamber size with normal wall motion,  the estimated EF is 55-60%.     03/2018 nuclear stress  There was no ST segment deviation noted during stress.  The study is normal. There are no perfusion defects consistent with prior infarct or current ischemia.  This is a low risk study.  The left ventricular ejection fraction is normal (55-65%).   Labs/Other Tests and Data Reviewed:    EKG:  No ECG reviewed.  Recent Labs: 01/04/2019: ALT 41; BUN 14; Creatinine, Ser 0.87; Potassium 4.5; Sodium 138   Recent Lipid Panel Lab Results  Component Value Date/Time   CHOL 113 01/04/2019 10:09 AM   TRIG 91 01/04/2019 10:09 AM   HDL 30 (L) 01/04/2019 10:09 AM   CHOLHDL 3.8 01/04/2019 10:09 AM   LDLCALC 65 01/04/2019 10:09 AM    Wt Readings from Last 3 Encounters:  02/01/19 198 lb (89.8 kg)  01/04/19 200 lb 6.4 oz (90.9 kg)  07/21/18 201 lb (91.2 kg)  Objective:    Vital Signs:  BP 137/88   Pulse 80   Ht 5' 7"  (1.702 m)   Wt 198 lb (89.8 kg)   BMI 31.01 kg/m    Normal affect. Normal speech pattern and tone. No audible signs of SOB or wheezing. Comfortable, no apparent distress  ASSESSMENT & PLAN:     1. 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 HTN - essentially at goal, continue current meds   3. Hyperlipidemia - LDL at goal, continue statin  COVID-19 Education: The signs and symptoms of COVID-19 were discussed with the patient and how to seek care for testing (follow up with PCP or arrange E-visit).  The importance of social distancing was discussed today.  Time:   Today, I have spent 13 minutes with the patient with telehealth technology discussing the above problems.     Medication Adjustments/Labs and Tests Ordered: Current medicines are reviewed at length with the patient today.  Concerns regarding medicines are outlined above.   Tests Ordered: No  orders of the defined types were placed in this encounter.   Medication Changes: No orders of the defined types were placed in this encounter.   Follow Up:  In Person in 6 month(s)  Signed, Carlyle Dolly, MD  02/01/2019 11:29 AM    [employer]

## 2019-02-01 NOTE — Patient Instructions (Signed)
Medication Instructions:  Your physician recommends that you continue on your current medications as directed. Please refer to the Current Medication list given to you today.  If you need a refill on your cardiac medications before your next appointment, please call your pharmacy.   Lab work: NONE   If you have labs (blood work) drawn today and your tests are completely normal, you will receive your results only by: Marland Kitchen MyChart Message (if you have MyChart) OR . A paper copy in the mail If you have any lab test that is abnormal or we need to change your treatment, we will call you to review the results.  Testing/Procedures: NONE   Follow-Up: At [facility], you and your health needs are our priority.  As part of our continuing mission to provide you with exceptional heart care, we have created designated Provider Care Teams.  These Care Teams include your primary Cardiologist (physician) and Advanced Practice Providers (APPs -  Physician Assistants and Nurse Practitioners) who all work together to provide you with the care you need, when you need it. You will need a follow up appointment in 6 months.  Please call our office 2 months in advance to schedule this appointment.  You may see Carlyle Dolly, MD or one of the following Advanced Practice Providers on your designated Care Team:   Bernerd Pho, PA-C [facility]) . Ermalinda Barrios, PA-C ([place])  Any Other Special Instructions Will Be Listed Below (If Applicable). Thank you for choosing [facility]!

## 2019-02-04 ENCOUNTER — Ambulatory Visit (HOSPITAL_COMMUNITY)
Admission: RE | Admit: 2019-02-04 | Discharge: 2019-02-04 | Disposition: A | Discharge status: Home / Self Care | Payer: Self-pay | Source: Ambulatory Visit | Attending: Diagnostic Neuroimaging | Admitting: Diagnostic Neuroimaging

## 2019-02-04 ENCOUNTER — Other Ambulatory Visit: Payer: Self-pay

## 2019-02-04 DIAGNOSIS — I671 Cerebral aneurysm, nonruptured: Secondary | ICD-10-CM | POA: Insufficient documentation

## 2019-02-04 MED ORDER — IOHEXOL 350 MG/ML SOLN
75.0000 mL | Freq: Once | INTRAVENOUS | Status: AC | PRN
  Administered 2019-02-04: 18:00:00 75 mL via INTRAVENOUS

## 2019-02-10 ENCOUNTER — Telehealth: Payer: Self-pay | Admitting: *Deleted

## 2019-02-10 NOTE — Telephone Encounter (Signed)
Spoke with patient and informed him his CT angio of head showed stable results, no change in his treatment plan. He verbalized understanding, appreciation.

## 2019-03-05 ENCOUNTER — Other Ambulatory Visit: Payer: Self-pay | Admitting: Cardiology

## 2019-04-01 ENCOUNTER — Ambulatory Visit: Payer: Self-pay | Admitting: Neurology

## 2019-04-01 ENCOUNTER — Other Ambulatory Visit: Payer: Self-pay | Admitting: Physician Assistant

## 2019-04-01 DIAGNOSIS — I10 Essential (primary) hypertension: Secondary | ICD-10-CM

## 2019-04-01 DIAGNOSIS — I2510 Atherosclerotic heart disease of native coronary artery without angina pectoris: Secondary | ICD-10-CM

## 2019-04-05 ENCOUNTER — Ambulatory Visit: Payer: Self-pay | Admitting: Diagnostic Neuroimaging

## 2019-04-08 ENCOUNTER — Other Ambulatory Visit: Payer: Self-pay | Admitting: Cardiology

## 2019-04-08 ENCOUNTER — Other Ambulatory Visit: Payer: Self-pay | Admitting: Physician Assistant

## 2019-05-05 ENCOUNTER — Other Ambulatory Visit (HOSPITAL_COMMUNITY)
Admission: RE | Admit: 2019-05-05 | Discharge: 2019-05-05 | Disposition: A | Discharge status: Home / Self Care | Payer: Self-pay | Source: Ambulatory Visit | Attending: Physician Assistant | Admitting: Physician Assistant

## 2019-05-05 DIAGNOSIS — I2510 Atherosclerotic heart disease of native coronary artery without angina pectoris: Secondary | ICD-10-CM | POA: Insufficient documentation

## 2019-05-05 DIAGNOSIS — I10 Essential (primary) hypertension: Secondary | ICD-10-CM | POA: Insufficient documentation

## 2019-05-05 LAB — COMPREHENSIVE METABOLIC PANEL
ALT: 26 U/L (ref 0–44)
AST: 21 U/L (ref 15–41)
Albumin: 4.2 g/dL (ref 3.5–5.0)
Alkaline Phosphatase: 69 U/L (ref 38–126)
Anion gap: 9 (ref 5–15)
BUN: 13 mg/dL (ref 6–20)
CO2: 24 mmol/L (ref 22–32)
Calcium: 9.1 mg/dL (ref 8.9–10.3)
Chloride: 106 mmol/L (ref 98–111)
Creatinine, Ser: 0.84 mg/dL (ref 0.61–1.24)
GFR calc Af Amer: >60 mL/min (ref >60)
GFR calc non Af Amer: >60 mL/min (ref >60)
Glucose, Bld: 102 mg/dL — ABNORMAL HIGH (ref 70–99)
Potassium: 4 mmol/L (ref 3.5–5.1)
Sodium: 139 mmol/L (ref 135–145)
Total Bilirubin: 1.6 mg/dL — ABNORMAL HIGH (ref 0.3–1.2)
Total Protein: 7.6 g/dL (ref 6.5–8.1)

## 2019-05-05 LAB — LIPID PANEL
Cholesterol: 98 mg/dL (ref 0–200)
HDL: 29 mg/dL — ABNORMAL LOW (ref >40)
LDL Cholesterol: 56 mg/dL (ref 0–99)
Total CHOL/HDL Ratio: 3.4 RATIO
Triglycerides: 67 mg/dL (ref <150)
VLDL: 13 mg/dL (ref 0–40)

## 2019-05-10 ENCOUNTER — Ambulatory Visit: Payer: Self-pay | Admitting: Physician Assistant

## 2019-05-10 ENCOUNTER — Encounter: Payer: Self-pay | Admitting: Physician Assistant

## 2019-05-10 DIAGNOSIS — I671 Cerebral aneurysm, nonruptured: Secondary | ICD-10-CM

## 2019-05-10 DIAGNOSIS — I10 Essential (primary) hypertension: Secondary | ICD-10-CM

## 2019-05-10 DIAGNOSIS — F17200 Nicotine dependence, unspecified, uncomplicated: Secondary | ICD-10-CM

## 2019-05-10 DIAGNOSIS — F3130 Bipolar disorder, current episode depressed, mild or moderate severity, unspecified: Secondary | ICD-10-CM

## 2019-05-10 DIAGNOSIS — I2510 Atherosclerotic heart disease of native coronary artery without angina pectoris: Secondary | ICD-10-CM

## 2019-05-10 DIAGNOSIS — F603 Borderline personality disorder: Secondary | ICD-10-CM

## 2019-05-10 DIAGNOSIS — E785 Hyperlipidemia, unspecified: Secondary | ICD-10-CM

## 2019-05-10 NOTE — Progress Notes (Signed)
There were no vitals taken for this visit.   Subjective:    Patient ID: Brad Randolph, male    DOB: 12/12/1975, 43 y.o.   MRN: [id number]  HPI: Brad Randolph is a 43 y.o. male presenting on 05/10/2019 for Follow-up   HPI  This is a telemedicine appointment due to coronavirus pandemic.  It is via telephone as patient does not have a video enabled device. Telephone  I connected with  Brad Randolph on 05/10/19 by a video enabled telemedicine application and verified that I am speaking with the correct person using two identifiers.   I discussed the limitations of evaluation and management by telemedicine. The patient expressed understanding and agreed to proceed.  Patient is currently at a friend's house.  Provider is at office.       Pt is at a friends house so he can't check his bp  bp 2 wk ago when he got his [place] shot- he doesn't remember what it was but was told it was good  He is currently Not working  He is still going to [place] for [facility] care.[facility]  He says he is exercising/staying actvie in his yard  He is still smoking.  He says his Breathing is okay-he uses his inhaler when needed.   He has no new complaints today.   Relevant past medical, surgical, family and social history reviewed and updated as indicated. Interim medical history since our last visit reviewed. Allergies and medications reviewed and updated.    Current Outpatient Medications:  .  ACETAMINOPHEN PO, Take by mouth., Disp: , Rfl:  .  albuterol (VENTOLIN HFA) 108 (90 Base) MCG/ACT inhaler, Inhale 2 puffs into the lungs every 6 (six) hours as needed for wheezing or shortness of breath., Disp: 21 g, Rfl: 1 .  amLODipine (NORVASC) 5 MG tablet, TAKE 1 Tablet BY MOUTH ONCE DAILY, Disp: 90 tablet, Rfl: 3 .  aspirin EC 81 MG tablet, Take 1 tablet (81 mg total) by mouth daily., Disp: 90 tablet, Rfl: 3 .  atorvastatin (LIPITOR) 80 MG tablet, TAKE 1/2 Tablet BY MOUTH EVERY DAY, Disp: 45 tablet, Rfl: 3 .   hydrOXYzine (VISTARIL) 25 MG capsule, Take 25 mg by mouth daily as needed for anxiety., Disp: , Rfl:  .  isosorbide mononitrate (IMDUR) 30 MG 24 hr tablet, Take 1 tablet (30 mg total) by mouth daily., Disp: 90 tablet, Rfl: 3 .  metoprolol tartrate (LOPRESSOR) 25 MG tablet, TAKE 1 Tablet  BY MOUTH TWICE DAILY, Disp: 180 tablet, Rfl: 0 .  nitroGLYCERIN (NITROSTAT) 0.4 MG SL tablet, Place 1 tablet (0.4 mg total) under the tongue every 5 (five) minutes as needed for chest pain., Disp: 25 tablet, Rfl: 1 .  Paliperidone Palmitate ER (INVEGA SUSTENNA) 117 MG/0.75ML SUSY, Inject into the muscle every 30 (thirty) days., Disp: , Rfl:  .  benztropine (COGENTIN) 1 MG tablet, Take 1 mg by mouth 2 (two) times daily., Disp: , Rfl:  .  gabapentin (NEURONTIN) 300 MG capsule, Take 1 capsule (300 mg total) by mouth 2 (two) times daily. (Patient not taking: Reported on 05/10/2019), Disp: 60 capsule, Rfl: 12 .  topiramate (TOPAMAX) 25 MG tablet, Take 25 mg by mouth 2 (two) times daily as needed (Headache)., Disp: , Rfl:    Review of Systems  Per HPI unless specifically indicated above     Objective:    There were no vitals taken for this visit.  Wt Readings from Last 3 Encounters:  02/01/19 198 lb (89.8  kg)  01/04/19 200 lb 6.4 oz (90.9 kg)  07/21/18 201 lb (91.2 kg)    Physical Exam Pulmonary:     Effort: No respiratory distress.  Neurological:     Mental Status: He is alert and oriented to person, place, and time.  Psychiatric:        Attention and Perception: Attention normal.        Behavior: Behavior is cooperative.     Comments: Patient is at his baseline     Results for orders placed or performed during the hospital encounter of 05/05/19  Lipid panel  Result Value Ref Range   Cholesterol 98 0 - 200 mg/dL   Triglycerides 67 <150 mg/dL   HDL 29 (L) >40 mg/dL   Total CHOL/HDL Ratio 3.4 RATIO   VLDL 13 0 - 40 mg/dL   LDL Cholesterol 56 0 - 99 mg/dL  Comprehensive metabolic panel  Result  Value Ref Range   Sodium 139 135 - 145 mmol/L   Potassium 4.0 3.5 - 5.1 mmol/L   Chloride 106 98 - 111 mmol/L   CO2 24 22 - 32 mmol/L   Glucose, Bld 102 (H) 70 - 99 mg/dL   BUN 13 6 - 20 mg/dL   Creatinine, Ser 0.84 0.61 - 1.24 mg/dL   Calcium 9.1 8.9 - 10.3 mg/dL   Total Protein 7.6 6.5 - 8.1 g/dL   Albumin 4.2 3.5 - 5.0 g/dL   AST 21 15 - 41 U/L   ALT 26 0 - 44 U/L   Alkaline Phosphatase 69 38 - 126 U/L   Total Bilirubin 1.6 (H) 0.3 - 1.2 mg/dL   GFR calc non Af Amer >60 >60 mL/min   GFR calc Af Amer >60 >60 mL/min   Anion gap 9 5 - 15      Assessment & Plan:   Encounter Diagnoses  Name Primary?  . Essential hypertension Yes  . Tobacco use disorder   . Hyperlipidemia, unspecified hyperlipidemia type   . 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 Borderline personality disorder ([place])   . Bipolar affective disorder, current episode depressed, current episode severity unspecified ([place])   . Intracranial aneurysm      Reviewed labs with patient.  Labs good  Patient to continue with [facility] for mental health issues.  Patient is to continue current medications.  Patient to continue with cardiology per their recommendation.  Patient has follow-up with neurology later this month.  Patient is encouraged to follow CDC guidelines including wearing a mask when around others to reduce risk of COVID-19 transmission.  Patient is to follow-up in 3 months.  He is to contact office sooner as needed.

## 2019-05-24 ENCOUNTER — Ambulatory Visit: Payer: Self-pay | Admitting: Diagnostic Neuroimaging

## 2019-05-24 ENCOUNTER — Encounter: Payer: Self-pay | Admitting: Diagnostic Neuroimaging

## 2019-07-07 ENCOUNTER — Other Ambulatory Visit: Payer: Self-pay | Admitting: Student

## 2019-07-07 ENCOUNTER — Other Ambulatory Visit: Payer: Self-pay | Admitting: Physician Assistant

## 2019-07-07 NOTE — Telephone Encounter (Signed)
This is a [place] pt.  °

## 2019-08-11 ENCOUNTER — Other Ambulatory Visit (HOSPITAL_COMMUNITY)
Admission: RE | Admit: 2019-08-11 | Discharge: 2019-08-11 | Disposition: A | Discharge status: Home / Self Care | Payer: Self-pay | Source: Ambulatory Visit | Attending: Physician Assistant | Admitting: Physician Assistant

## 2019-08-11 ENCOUNTER — Other Ambulatory Visit: Payer: Self-pay

## 2019-08-11 DIAGNOSIS — E785 Hyperlipidemia, unspecified: Secondary | ICD-10-CM | POA: Insufficient documentation

## 2019-08-11 DIAGNOSIS — I2510 Atherosclerotic heart disease of native coronary artery without angina pectoris: Secondary | ICD-10-CM | POA: Insufficient documentation

## 2019-08-11 DIAGNOSIS — I10 Essential (primary) hypertension: Secondary | ICD-10-CM | POA: Insufficient documentation

## 2019-08-11 LAB — COMPREHENSIVE METABOLIC PANEL
ALT: 30 U/L (ref 0–44)
AST: 33 U/L (ref 15–41)
Albumin: 4.1 g/dL (ref 3.5–5.0)
Alkaline Phosphatase: 67 U/L (ref 38–126)
Anion gap: 8 (ref 5–15)
BUN: 8 mg/dL (ref 6–20)
CO2: 25 mmol/L (ref 22–32)
Calcium: 8.8 mg/dL — ABNORMAL LOW (ref 8.9–10.3)
Chloride: 104 mmol/L (ref 98–111)
Creatinine, Ser: 0.94 mg/dL (ref 0.61–1.24)
GFR calc Af Amer: >60 mL/min (ref >60)
GFR calc non Af Amer: >60 mL/min (ref >60)
Glucose, Bld: 117 mg/dL — ABNORMAL HIGH (ref 70–99)
Potassium: 4.2 mmol/L (ref 3.5–5.1)
Sodium: 137 mmol/L (ref 135–145)
Total Bilirubin: 1.6 mg/dL — ABNORMAL HIGH (ref 0.3–1.2)
Total Protein: 7.3 g/dL (ref 6.5–8.1)

## 2019-08-11 LAB — LIPID PANEL
Cholesterol: 89 mg/dL (ref 0–200)
HDL: 28 mg/dL — ABNORMAL LOW (ref >40)
LDL Cholesterol: 52 mg/dL (ref 0–99)
Total CHOL/HDL Ratio: 3.2 RATIO
Triglycerides: 46 mg/dL (ref <150)
VLDL: 9 mg/dL (ref 0–40)

## 2019-08-16 ENCOUNTER — Encounter: Payer: Self-pay | Admitting: Physician Assistant

## 2019-08-16 ENCOUNTER — Other Ambulatory Visit: Payer: Self-pay

## 2019-08-16 ENCOUNTER — Ambulatory Visit: Payer: Self-pay | Admitting: Physician Assistant

## 2019-08-16 VITALS — BP 100/60 | HR 71 | Temp 98.1°F | Wt 193.4 lb

## 2019-08-16 DIAGNOSIS — I10 Essential (primary) hypertension: Secondary | ICD-10-CM

## 2019-08-16 DIAGNOSIS — I2510 Atherosclerotic heart disease of native coronary artery without angina pectoris: Secondary | ICD-10-CM

## 2019-08-16 DIAGNOSIS — F603 Borderline personality disorder: Secondary | ICD-10-CM

## 2019-08-16 DIAGNOSIS — F17200 Nicotine dependence, unspecified, uncomplicated: Secondary | ICD-10-CM

## 2019-08-16 DIAGNOSIS — F3130 Bipolar disorder, current episode depressed, mild or moderate severity, unspecified: Secondary | ICD-10-CM

## 2019-08-16 DIAGNOSIS — E785 Hyperlipidemia, unspecified: Secondary | ICD-10-CM

## 2019-08-16 DIAGNOSIS — I671 Cerebral aneurysm, nonruptured: Secondary | ICD-10-CM

## 2019-08-16 NOTE — Progress Notes (Signed)
BP 100/60   Pulse 71   Temp 98.1 F (36.7 C)   Wt 193 lb 6.4 oz (87.7 kg)   SpO2 96%   BMI 30.29 kg/m    Subjective:    Patient ID: Brad Randolph, male    DOB: Nov 13, 1975, 44 y.o.   MRN: [id number]  HPI: Brad Randolph is a 44 y.o. male presenting on 08/16/2019 for Follow-up   HPI   Pt had a negative covid 19 screening questionnaire.     Pt still going to [place] in [place] for [facility] care.  He says [facility] in [place] didn't work out.    He is not working.  He is trying to go through [facility]hab.   He is still smoking.  He says he is walking for exercise, at least 5 days/week.    CTA- 02/05/19- 2.39m aneurysm  CAD- NSTEMI 2017with DES to RCA.  He saw Dr BHarl BowieAug 2020  Pt says he is doing fine and he has no complaints today.  He is not having any CP or SOB.    Relevant past medical, surgical, family and social history reviewed and updated as indicated. Interim medical history since our last visit reviewed. Allergies and medications reviewed and updated.   Current Outpatient Medications:  .  ACETAMINOPHEN PO, Take by mouth., Disp: , Rfl:  .  amLODipine (NORVASC) 5 MG tablet, TAKE 1 Tablet BY MOUTH ONCE DAILY, Disp: 90 tablet, Rfl: 3 .  ASPIRIN ADULT LOW STRENGTH 81 MG EC tablet, TAKE 1 Tablet BY MOUTH ONCE DAILY, Disp: 90 tablet, Rfl: 3 .  atorvastatin (LIPITOR) 80 MG tablet, TAKE 1/2 Tablet BY MOUTH EVERY DAY, Disp: 45 tablet, Rfl: 3 .  gabapentin (NEURONTIN) 300 MG capsule, Take 1 capsule (300 mg total) by mouth 2 (two) times daily. (Patient taking differently: Take 300 mg by mouth 2 (two) times daily as needed. ), Disp: 60 capsule, Rfl: 12 .  isosorbide mononitrate (IMDUR) 30 MG 24 hr tablet, TAKE 1 Tablet BY MOUTH ONCE DAILY, Disp: 90 tablet, Rfl: 3 .  metoprolol tartrate (LOPRESSOR) 25 MG tablet, TAKE 1 Tablet  BY MOUTH TWICE DAILY, Disp: 180 tablet, Rfl: 0 .  nitroGLYCERIN (NITROSTAT) 0.4 MG SL tablet, Place 1 tablet (0.4 mg total) under the tongue every 5 (five)  minutes as needed for chest pain., Disp: 25 tablet, Rfl: 1 .  Paliperidone Palmitate ER (INVEGA SUSTENNA) 117 MG/0.75ML SUSY, Inject into the muscle every 30 (thirty) days., Disp: , Rfl:  .  PROVENTIL HFA 108 (90 Base) MCG/ACT inhaler, INHALE 2 PUFFS BY MOUTH EVERY 6 HOURS AS NEEDED FOR COUGHING, WHEEZING, OR SHORTNESS OF BREATH, Disp: 20.1 g, Rfl: 1 .  benztropine (COGENTIN) 1 MG tablet, Take 1 mg by mouth 2 (two) times daily., Disp: , Rfl:  .  hydrOXYzine (VISTARIL) 25 MG capsule, Take 25 mg by mouth daily as needed for anxiety., Disp: , Rfl:  .  topiramate (TOPAMAX) 25 MG tablet, Take 25 mg by mouth 2 (two) times daily as needed (Headache)., Disp: , Rfl:      Review of Systems  Per HPI unless specifically indicated above     Objective:    BP 100/60   Pulse 71   Temp 98.1 F (36.7 C)   Wt 193 lb 6.4 oz (87.7 kg)   SpO2 96%   BMI 30.29 kg/m   Wt Readings from Last 3 Encounters:  08/16/19 193 lb 6.4 oz (87.7 kg)  02/01/19 198 lb (89.8 kg)  01/04/19 200 lb  6.4 oz (90.9 kg)    Physical Exam Vitals reviewed.  Constitutional:      Appearance: He is well-developed.  HENT:     Head: Normocephalic and atraumatic.  Cardiovascular:     Rate and Rhythm: Normal rate and regular rhythm.  Pulmonary:     Effort: Pulmonary effort is normal.     Breath sounds: Normal breath sounds. No wheezing.  Abdominal:     General: Bowel sounds are normal.     Palpations: Abdomen is soft.     Tenderness: There is no abdominal tenderness.  Musculoskeletal:     Cervical back: Neck supple.  Lymphadenopathy:     Cervical: No cervical adenopathy.  Skin:    General: Skin is warm and dry.  Neurological:     Mental Status: He is alert and oriented to person, place, and time.     Comments: Mild left sided weakness   Psychiatric:        Attention and Perception: Attention normal.        Behavior: Behavior normal. Behavior is cooperative.      Mild left weakness  Results for orders placed or  performed during the hospital encounter of 08/11/19  Lipid panel  Result Value Ref Range   Cholesterol 89 0 - 200 mg/dL   Triglycerides 46 <150 mg/dL   HDL 28 (L) >40 mg/dL   Total CHOL/HDL Ratio 3.2 RATIO   VLDL 9 0 - 40 mg/dL   LDL Cholesterol 52 0 - 99 mg/dL  Comprehensive metabolic panel  Result Value Ref Range   Sodium 137 135 - 145 mmol/L   Potassium 4.2 3.5 - 5.1 mmol/L   Chloride 104 98 - 111 mmol/L   CO2 25 22 - 32 mmol/L   Glucose, Bld 117 (H) 70 - 99 mg/dL   BUN 8 6 - 20 mg/dL   Creatinine, Ser 0.94 0.61 - 1.24 mg/dL   Calcium 8.8 (L) 8.9 - 10.3 mg/dL   Total Protein 7.3 6.5 - 8.1 g/dL   Albumin 4.1 3.5 - 5.0 g/dL   AST 33 15 - 41 U/L   ALT 30 0 - 44 U/L   Alkaline Phosphatase 67 38 - 126 U/L   Total Bilirubin 1.6 (H) 0.3 - 1.2 mg/dL   GFR calc non Af Amer >60 >60 mL/min   GFR calc Af Amer >60 >60 mL/min   Anion gap 8 5 - 15      Assessment & Plan:    Encounter Diagnoses  Name Primary?  . Essential hypertension Yes  . Hyperlipidemia, unspecified hyperlipidemia type   . Tobacco use disorder   . Borderline personality disorder ([place])   . Bipolar affective disorder, current episode depressed, current episode severity unspecified ([place])   . 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 Intracranial aneurysm       -reviewed labs with pt -pt to continue current medications -encouraged pt to continue with regular exercise -encouraged smoking cessation -pt to Continue with [facility] for mental health issues -pt due for f/u with cardiology.  He is encouraged to contact that office to schedule -pt will need repeat CTA for aneurysm in august -pt to follow up  6 months.  He is to contact office sooner prn

## 2019-08-30 ENCOUNTER — Other Ambulatory Visit: Payer: Self-pay

## 2019-08-30 ENCOUNTER — Emergency Department (HOSPITAL_COMMUNITY): Payer: Self-pay

## 2019-08-30 ENCOUNTER — Encounter (HOSPITAL_COMMUNITY): Payer: Self-pay | Admitting: Emergency Medicine

## 2019-08-30 ENCOUNTER — Emergency Department (HOSPITAL_COMMUNITY)
Admission: EM | Admit: 2019-08-30 | Discharge: 2019-08-30 | Disposition: A | Discharge status: Home / Self Care | Payer: Self-pay | Attending: Emergency Medicine | Admitting: Emergency Medicine

## 2019-08-30 DIAGNOSIS — I10 Essential (primary) hypertension: Secondary | ICD-10-CM | POA: Insufficient documentation

## 2019-08-30 DIAGNOSIS — Z7982 Long term (current) use of aspirin: Secondary | ICD-10-CM | POA: Insufficient documentation

## 2019-08-30 DIAGNOSIS — M545 Low back pain, unspecified: Secondary | ICD-10-CM

## 2019-08-30 DIAGNOSIS — J45909 Unspecified asthma, uncomplicated: Secondary | ICD-10-CM | POA: Insufficient documentation

## 2019-08-30 DIAGNOSIS — F17210 Nicotine dependence, cigarettes, uncomplicated: Secondary | ICD-10-CM | POA: Insufficient documentation

## 2019-08-30 DIAGNOSIS — Z79899 Other long term (current) drug therapy: Secondary | ICD-10-CM | POA: Insufficient documentation

## 2019-08-30 DIAGNOSIS — I2510 Atherosclerotic heart disease of native coronary artery without angina pectoris: Secondary | ICD-10-CM | POA: Insufficient documentation

## 2019-08-30 MED ORDER — CYCLOBENZAPRINE HCL 10 MG PO TABS
5.0000 mg | ORAL_TABLET | Freq: Once | ORAL | Status: AC
  Administered 2019-08-30: 5 mg via ORAL
  Filled 2019-08-30: qty 1

## 2019-08-30 MED ORDER — CYCLOBENZAPRINE HCL 10 MG PO TABS: 10.0000 mg | ORAL_TABLET | Freq: Two times a day (BID) | ORAL | 0 refills | Status: DC | PRN

## 2019-08-30 MED ORDER — HYDROCODONE-ACETAMINOPHEN 5-325 MG PO TABS: 1.0000 | ORAL_TABLET | Freq: Four times a day (QID) | ORAL | 0 refills | Status: DC | PRN

## 2019-08-30 MED ORDER — LIDOCAINE 5 % EX PTCH: 1.0000 | MEDICATED_PATCH | CUTANEOUS | 0 refills | Status: DC

## 2019-08-30 MED ORDER — NAPROXEN 500 MG PO TABS: 500.0000 mg | ORAL_TABLET | Freq: Two times a day (BID) | ORAL | 0 refills | Status: DC

## 2019-08-30 MED ORDER — HYDROCODONE-ACETAMINOPHEN 5-325 MG PO TABS
1.0000 | ORAL_TABLET | Freq: Once | ORAL | Status: AC
  Administered 2019-08-30: 10:00:00 1 via ORAL
  Filled 2019-08-30: qty 1

## 2019-08-30 MED ORDER — KETOROLAC TROMETHAMINE 30 MG/ML IJ SOLN
30.0000 mg | Freq: Once | INTRAMUSCULAR | Status: AC
  Administered 2019-08-30: 10:00:00 30 mg via INTRAMUSCULAR
  Filled 2019-08-30: qty 1

## 2019-08-30 NOTE — ED Provider Notes (Signed)
[facility] EMERGENCY DEPARTMENT Provider Note   CSN: [id number] Arrival date & time: 08/30/19  1583    History Chief Complaint  Patient presents with  . Back Pain    Brad Randolph is a 44 y.o. male with has medical history significant for anxiety, bipolar, CAD, hypertension who presents for evaluation of back pain.  Patient states he woke up this morning and bent over to put his pant legs on and felt a sharp pain to his lower back.  Pain located to midline and bilateral paraspinal muscles to his lumbar spine.  He denies any recent falls.  Denies IV drug use, bowel or bladder incontinence, saddle paresthesias.  He denies any radicular pains.  Rates pain 10/10.  He is not take anything for symptoms.  Admits to chronic back pain.  States he was seen by orthopedics "years ago."  However does not have follow-up.  Pain worse with twisting and bending.  Denies fever, chills, nausea, vomiting, chest pain, shortness of breath, hemoptysis, abdominal pain, diarrhea, dysuria, unilateral leg swelling, redness, warmth, paresthesias to extremities.  Denies additional aggravating or alleviating factors  History obtained from patient and past medical records.  No interpreter is used.  HPI     Past Medical History:  Diagnosis Date  . Anxiety   . Asthma    chilldhood  . Bipolar disorder ([place])   . Borderline personality disorder ([place])   . CAD (coronary artery disease)    a. s/p DESx2 to RCA in 12/2015 b. low-risk NST in 03/2018  . Depression   . Heart attack ([place])   . Hyperlipidemia   . Hypertension   . Migraine   . Seizures ([place]) 2018   last sz 12/2017    Patient Active Problem List   Diagnosis Date Noted  . Chest pain 06/25/2018  . Nonepileptic episode ([place]) 04/17/2018  . Migraine with aura and without status migrainosus, not intractable 04/17/2018  . Abnormal CT of brain 04/17/2018  . Borderline personality disorder ([place]) 04/17/2018  . Bipolar affective disorder, current episode depressed  ([place]) 04/17/2018  . CAD S/P percutaneous coronary angioplasty 03/18/2018  . Essential hypertension 03/18/2018  . Intracranial aneurysm 03/18/2018  . History of MI (myocardial infarction) 03/18/2018    Past Surgical History:  Procedure Laterality Date  . CORONARY ANGIOPLASTY WITH STENT PLACEMENT         Family History  Problem Relation Age of Onset  . Diabetes Mother   . Hypertension Mother   . Diabetes Sister   . Cancer Sister   . Diabetes Maternal Aunt   . Cancer Paternal Aunt   . Diabetes Maternal Grandmother   . Hypertension Maternal Grandmother   . Hypertension Maternal Grandfather   . Cancer Maternal Grandfather        lung cancer  . Hypertension Paternal Grandmother   . Heart disease Father   . Diabetes Father     Social History   Tobacco Use  . Smoking status: Current Every Day Smoker    Packs/day: 0.25    Years: 33.00    Pack years: 8.25  . Smokeless tobacco: Never Used  Substance Use Topics  . Alcohol use: Not Currently    Comment: previously drunk socially  . 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 Authorizing Provider  ACETAMINOPHEN PO Take by mouth.  [provider]  amLODipine (NORVASC) 5 MG tablet TAKE 1 Tablet BY MOUTH ONCE DAILY 04/08/19   [name], MD  ASPIRIN ADULT LOW STRENGTH 81 MG EC tablet TAKE 1 Tablet BY MOUTH ONCE DAILY 07/07/19   [name], MD  atorvastatin (LIPITOR) 80 MG tablet TAKE 1/2 Tablet BY MOUTH EVERY DAY 03/05/19   [name], MD  benztropine (COGENTIN) 1 MG tablet Take 1 mg by mouth 2 (two) times daily.    [provider]  cyclobenzaprine (FLEXERIL) 10 MG tablet Take 1 tablet (10 mg total) by mouth 2 (two) times daily as needed for muscle spasms. 08/30/19   Hanson Medeiros A, PA-C  gabapentin (NEURONTIN) 300 MG capsule  Take 1 capsule (300 mg total) by mouth 2 (two) times daily. Patient taking differently: Take 300 mg by mouth 2 (two) times daily as needed.  10/05/18   Penumalli, Earlean Polka, MD  hydrOXYzine (VISTARIL) 25 MG capsule Take 25 mg by mouth daily as needed for anxiety.    [provider]  isosorbide mononitrate (IMDUR) 30 MG 24 hr tablet TAKE 1 Tablet BY MOUTH ONCE DAILY 07/07/19   [name], MD  lidocaine (LIDODERM) 5 % Place 1 patch onto the skin daily. Remove & Discard patch within 12 hours or as directed by MD 08/30/19   Masaki Rothbauer A, PA-C  metoprolol tartrate (LOPRESSOR) 25 MG tablet TAKE 1 Tablet  BY MOUTH TWICE DAILY 07/07/19   Soyla Dryer, PA-C  naproxen (NAPROSYN) 500 MG tablet Take 1 tablet (500 mg total) by mouth 2 (two) times daily. 08/30/19   Lavana Huckeba A, PA-C  nitroGLYCERIN (NITROSTAT) 0.4 MG SL tablet Place 1 tablet (0.4 mg total) under the tongue every 5 (five) minutes as needed for chest pain. 03/18/18   Soyla Dryer, PA-C  Paliperidone Palmitate ER (INVEGA SUSTENNA) 117 MG/0.75ML SUSY Inject into the muscle every 30 (thirty) days.    [provider]  PROVENTIL HFA 108 (90 Base) MCG/ACT inhaler INHALE 2 PUFFS BY MOUTH EVERY 6 HOURS AS NEEDED FOR COUGHING, WHEEZING, OR SHORTNESS OF BREATH 07/07/19   Soyla Dryer, PA-C  topiramate (TOPAMAX) 25 MG tablet Take 25 mg by mouth 2 (two) times daily as needed (Headache).    [provider]    Allergies    Soap, Tramadol, Mushroom extract complex, Other, and Trazodone and nefazodone  Review of Systems   Review of Systems  Constitutional: Negative.   HENT: Negative.   Respiratory: Negative.   Cardiovascular: Negative.   Gastrointestinal: Negative.   Musculoskeletal: Positive for back pain and gait problem (Pain worse with movement). Negative for arthralgias, joint swelling, myalgias, neck pain and neck stiffness.  Skin: Negative.   All other systems reviewed and are negative.   Physical  Exam Updated Vital Signs BP 113/78 (BP Location: Left Arm)   Pulse 63   Temp 98.3 F (36.8 C) (Oral)   Resp 18   Ht 5' 8"  (1.727 m)   Wt 88.5 kg   SpO2 99%   BMI 29.65 kg/m   Physical Exam Physical Exam  Constitutional: Pt appears well-developed and well-nourished. No distress.  HENT:  Head: Normocephalic and atraumatic.  Mouth/Throat: Oropharynx is clear and moist. No oropharyngeal exudate.  Eyes: Conjunctivae are normal.  Neck: Normal range of motion. Neck supple.  Full ROM without pain  Cardiovascular: Normal rate, regular rhythm and intact distal pulses.   Pulmonary/Chest: Effort normal and breath sounds normal. No respiratory distress. Pt has no wheezes.  Abdominal:  Soft. Pt exhibits no distension. There is no tenderness, rebound or guarding. No abd bruit or pulsatile mass Musculoskeletal:  Full range of motion of the T-spine and L-spine with flexion, hyperextension, and lateral flexion. No midline tenderness or stepoffs. No tenderness to palpation of the spinous processes of the T-spine or L-spine. Moderate tenderness to palpation of the paraspinous muscles of the L-spine. negative straight leg raise. Lymphadenopathy:    Pt has no cervical adenopathy.  Neurological: Pt is alert. Pt has normal reflexes.  Reflex Scores:      Bicep reflexes are 2+ on the right side and 2+ on the left side.      Brachioradialis reflexes are 2+ on the right side and 2+ on the left side.      Patellar reflexes are 2+ on the right side and 2+ on the left side.      Achilles reflexes are 2+ on the right side and 2+ on the left side. Speech is clear and goal oriented, follows commands Normal 5/5 strength in upper and lower extremities bilaterally including dorsiflexion and plantar flexion, strong and equal grip strength Sensation normal to light and sharp touch Moves extremities without ataxia, coordination intact Normal gait Normal balance No Clonus Skin: Skin is warm and dry. No rash noted  or lesions noted. Pt is not diaphoretic. No erythema, ecchymosis,edema or warmth.  Psychiatric: Pt has a normal mood and affect. Behavior is normal.  Nursing note and vitals reviewed. ED Results / Procedures / Treatments   Labs (all labs ordered are listed, but only abnormal results are displayed) Labs Reviewed - No data to display  EKG None  Radiology DG Lumbar Spine Complete  Result Date: 08/30/2019 CLINICAL DATA:  Acute lower back pain without reported injury. EXAM: LUMBAR SPINE - COMPLETE 4+ VIEW COMPARISON:  None. FINDINGS: No fracture or spondylolisthesis is noted. Minimal anterior osteophyte formation is noted at L2-3. Disc spaces are well-maintained. IMPRESSION: Minimal degenerative changes as described above. No acute abnormality seen in the lumbar spine. Electronically Signed   By: Marijo Conception M.D.   On: 08/30/2019 10:50    Procedures Procedures (including critical care time)  Medications Ordered in ED Medications  cyclobenzaprine (FLEXERIL) tablet 5 mg (5 mg Oral Given 08/30/19 1017)  ketorolac (TORADOL) 30 MG/ML injection 30 mg (30 mg Intramuscular Given 08/30/19 1018)  HYDROcodone-acetaminophen (NORCO/VICODIN) 5-325 MG per tablet 1 tablet (1 tablet Oral Given 08/30/19 1017)    ED Course  I have reviewed the triage vital signs and the nursing notes.  Pertinent labs & imaging results that were available during my care of the patient were reviewed by me and considered in my medical decision making (see chart for details).  31 old male appears otherwise well presents for evaluation of back pain after bending over and pulling up his pants.  No recent trauma.  No red flags for back pain, specifically no IV drug use, bowel or bladder incontinence, saddle paresthesias.  No radicular symptoms.  He has no overlying skin changes.  He has moderate tenderness to his midline lumbar spine and paraspinal musculature.  Negative straight leg raise.  Will treat symptomatically and obtain  x-rays.  X-ray with mild degenerative changes.  Pain resolved in the ED.  He is ambulatory with out difficulty.  No midline abdominal pain radiating to his back to suggest AAA or dissection.  He has no overlying skin changes or red flags to suggest bacterial infectious process.  Will DC home with symptomatic management.  He  is to return for any worsening symptoms or follow-up with orthopedics.  The patient has been appropriately medically screened and/or stabilized in the ED. I have low suspicion for any other emergent medical condition which would require further screening, evaluation or treatment in the ED or require inpatient management.  Patient is hemodynamically stable and in no acute distress.  Patient able to ambulate in department prior to ED.  Evaluation does not show acute pathology that would require ongoing or additional emergent interventions while in the emergency department or further inpatient treatment.  I have discussed the diagnosis with the patient and answered all questions.  Pain is been managed while in the emergency department and patient has no further complaints prior to discharge.  Patient is comfortable with plan discussed in room and is stable for discharge at this time.  I have discussed strict return precautions for returning to the emergency department.  Patient was encouraged to follow-up with PCP/specialist refer to at discharge.    MDM Rules/Calculators/A&P                      Final Clinical Impression(s) / ED Diagnoses Final diagnoses:  Acute bilateral low back pain without sciatica    Rx / DC Orders ED Discharge Orders         Ordered    naproxen (NAPROSYN) 500 MG tablet  2 times daily     08/30/19 1126    lidocaine (LIDODERM) 5 %  Every 24 hours     08/30/19 1126    cyclobenzaprine (FLEXERIL) 10 MG tablet  2 times daily PRN     08/30/19 1126           Allice Garro A, PA-C 08/30/19 1127    Milton Ferguson, MD 08/30/19 1345

## 2019-08-30 NOTE — Discharge Instructions (Signed)
Take the pain medicine as prescribed.  Return for any new or worsening symptoms

## 2019-08-30 NOTE — ED Triage Notes (Signed)
Pt reports lower back pain with waking this am. Pt reports went to get dressed this am and reports back has hurt ever since. Pt denies any gi/gu symptoms. Pt able to transfer to bed with assistance. Pt reports increased pain with movement.

## 2019-09-18 ENCOUNTER — Ambulatory Visit: Payer: Self-pay | Attending: Internal Medicine

## 2019-09-18 DIAGNOSIS — Z23 Encounter for immunization: Secondary | ICD-10-CM

## 2019-09-18 NOTE — Progress Notes (Signed)
   Covid-19 Vaccination Clinic  Name:  Brad Randolph    MRN: [id number] DOB: December 27, 1975  09/18/2019  Mr. Brad Randolph was observed post Covid-19 immunization for 15 minutes without incident. He was provided with Vaccine Information Sheet and instruction to access the V-Safe system.   Mr. Brad Randolph was instructed to call 911 with any severe reactions post vaccine: Marland Kitchen Difficulty breathing  . Swelling of face and throat  . A fast heartbeat  . A bad rash all over body  . Dizziness and weakness   Immunizations Administered    Name Date Dose VIS Date Route   Moderna COVID-19 Vaccine 09/18/2019  9:57 AM 0.5 mL 06/01/2019 Intramuscular   Manufacturer: Moderna   Lot: [id number]   [place]: [id number]

## 2019-09-20 ENCOUNTER — Telehealth: Payer: Self-pay

## 2019-09-20 NOTE — Telephone Encounter (Signed)
Virtual Visit Pre-Appointment Phone Call  "(Name), I am calling you today to discuss your upcoming appointment. We are currently trying to limit exposure to the virus that causes COVID-19 by seeing patients at home rather than in the office."  1. "What is the BEST phone number to call the day of the visit?" - include this in appointment notes  2. "Do you have or have access to (through a family member/friend) a smartphone with video capability that we can use for your visit?" a. If yes - list this number in appt notes as "cell" (if different from BEST phone #) and list the appointment type as a VIDEO visit in appointment notes b. If no - list the appointment type as a PHONE visit in appointment notes  3. Confirm consent - "In the setting of the current Covid19 crisis, you are scheduled for a (phone or video) visit with your provider on (date) at (time).  Just as we do with many in-office visits, in order for you to participate in this visit, we must obtain consent.  If you'd like, I can send this to your mychart (if signed up) or email for you to review.  Otherwise, I can obtain your verbal consent now.  All virtual visits are billed to your insurance company just like a normal visit would be.  By agreeing to a virtual visit, we'd like you to understand that the technology does not allow for your provider to perform an examination, and thus may limit your provider's ability to fully assess your condition. If your provider identifies any concerns that need to be evaluated in person, we will make arrangements to do so.  Finally, though the technology is pretty good, we cannot assure that it will always work on either your or our end, and in the setting of a video visit, we may have to convert it to a phone-only visit.  In either situation, we cannot ensure that we have a secure connection.  Are you willing to proceed?" STAFF: Did the patient verbally acknowledge consent to telehealth visit? Document  YES/NO here:   4. Advise patient to be prepared - "Two hours prior to your appointment, go ahead and check your blood pressure, pulse, oxygen saturation, and your weight (if you have the equipment to check those) and write them all down. When your visit starts, your provider will ask you for this information. If you have an Apple Watch or Kardia device, please plan to have heart rate information ready on the day of your appointment. Please have a pen and paper handy nearby the day of the visit as well."  5. Give patient instructions for MyChart download to smartphone OR [email or web address] as below if video visit (depending on what platform provider is using)  6. Inform patient they will receive a phone call 15 minutes prior to their appointment time (may be from unknown caller ID) so they should be prepared to answer    TELEPHONE CALL NOTE  Brad Randolph has been deemed a candidate for a follow-up tele-health visit to limit community exposure during the Covid-19 pandemic. I spoke with the patient via phone to ensure availability of phone/video source, confirm preferred email & phone number, and discuss instructions and expectations.  I reminded Brad Randolph to be prepared with any vital sign and/or heart rhythm information that could potentially be obtained via home monitoring, at the time of his visit. I reminded Brad Randolph to expect a phone call prior to his visit.  Dorothey Baseman 09/20/2019 4:53 PM   INSTRUCTIONS FOR DOWNLOADING THE MYCHART APP TO SMARTPHONE  - The patient must first make sure to have activated MyChart and know their login information - If Apple, go to App Store and type in MyChart in the search bar and download the app. If Android, ask patient to go to [employer] and type in Ava in the search bar and download the app. The app is free but as with any other app downloads, their phone may require them to verify saved payment information or Apple/Android password.  -  The patient will need to then log into the app with their MyChart username and password, and select [facility] as their healthcare provider to link the account. When it is time for your visit, go to the MyChart app, find appointments, and click Begin Video Visit. Be sure to Select Allow for your device to access the Microphone and Camera for your visit. You will then be connected, and your provider will be with you shortly.  **If they have any issues connecting, or need assistance please contact MyChart service desk [phone])**  **If using a computer, in order to ensure the best quality for their visit they will need to use either of the following Internet Browsers: [employer], or Google Chrome**  IF USING DOXIMITY or DOXY.ME - The patient will receive a link just prior to their visit by text.     FULL LENGTH CONSENT FOR TELE-HEALTH VISIT   I hereby voluntarily request, consent and authorize [facility] and its employed or contracted physicians, physician assistants, nurse practitioners or other licensed health care professionals (the Practitioner), to provide me with telemedicine health care services (the "Services") as deemed necessary by the treating Practitioner. I acknowledge and consent to receive the Services by the Practitioner via telemedicine. I understand that the telemedicine visit will involve communicating with the Practitioner through live audiovisual communication technology and the disclosure of certain medical information by electronic transmission. I acknowledge that I have been given the opportunity to request an in-person assessment or other available alternative prior to the telemedicine visit and am voluntarily participating in the telemedicine visit.  I understand that I have the right to withhold or withdraw my consent to the use of telemedicine in the course of my care at any time, without affecting my right to future care or treatment, and that the  Practitioner or I may terminate the telemedicine visit at any time. I understand that I have the right to inspect all information obtained and/or recorded in the course of the telemedicine visit and may receive copies of available information for a reasonable fee.  I understand that some of the potential risks of receiving the Services via telemedicine include:  Marland Kitchen Delay or interruption in medical evaluation due to technological equipment failure or disruption; . Information transmitted may not be sufficient (e.g. poor resolution of images) to allow for appropriate medical decision making by the Practitioner; and/or  . In rare instances, security protocols could fail, causing a breach of personal health information.  Furthermore, I acknowledge that it is my responsibility to provide information about my medical history, conditions and care that is complete and accurate to the best of my ability. I acknowledge that Practitioner's advice, recommendations, and/or decision may be based on factors not within their control, such as incomplete or inaccurate data provided by me or distortions of diagnostic images or specimens that may result from electronic transmissions. I understand that the practice  of medicine is not an Chief Strategy Officer and that Practitioner makes no warranties or guarantees regarding treatment outcomes. I acknowledge that I will receive a copy of this consent concurrently upon execution via email to the email address I last provided but may also request a printed copy by calling the office of [facility].    I understand that my insurance will be billed for this visit.   I have read or had this consent read to me. . I understand the contents of this consent, which adequately explains the benefits and risks of the Services being provided via telemedicine.  . I have been provided ample opportunity to ask questions regarding this consent and the Services and have had my questions answered to my  satisfaction. . I give my informed consent for the services to be provided through the use of telemedicine in my medical care  By participating in this telemedicine visit I agree to the above.

## 2019-09-27 ENCOUNTER — Telehealth (INDEPENDENT_AMBULATORY_CARE_PROVIDER_SITE_OTHER): Payer: Self-pay | Admitting: Cardiology

## 2019-09-27 ENCOUNTER — Encounter: Payer: Self-pay | Admitting: *Deleted

## 2019-09-27 VITALS — BP 110/80 | Ht 67.0 in | Wt 200.0 lb

## 2019-09-27 DIAGNOSIS — E782 Mixed hyperlipidemia: Secondary | ICD-10-CM

## 2019-09-27 DIAGNOSIS — I10 Essential (primary) hypertension: Secondary | ICD-10-CM

## 2019-09-27 DIAGNOSIS — I2510 Atherosclerotic heart disease of native coronary artery without angina pectoris: Secondary | ICD-10-CM

## 2019-09-27 DIAGNOSIS — I25118 Atherosclerotic heart disease of native coronary artery with other forms of angina pectoris: Secondary | ICD-10-CM

## 2019-09-27 DIAGNOSIS — F17210 Nicotine dependence, cigarettes, uncomplicated: Secondary | ICD-10-CM

## 2019-09-27 DIAGNOSIS — Z7189 Other specified counseling: Secondary | ICD-10-CM

## 2019-09-27 DIAGNOSIS — E785 Hyperlipidemia, unspecified: Secondary | ICD-10-CM

## 2019-09-27 DIAGNOSIS — Z955 Presence of coronary angioplasty implant and graft: Secondary | ICD-10-CM

## 2019-09-27 NOTE — Progress Notes (Signed)
Virtual Visit via Telephone Note   This visit type was conducted due to national recommendations for restrictions regarding the COVID-19 Pandemic (e.g. social distancing) in an effort to limit this patient's exposure and mitigate transmission in our community.  Due to his co-morbid illnesses, this patient is at least at moderate risk for complications without adequate follow up.  This format is felt to be most appropriate for this patient at this time.  The patient did not have access to video technology/had technical difficulties with video requiring transitioning to audio format only (telephone).  All issues noted in this document were discussed and addressed.  No physical exam could be performed with this format.  Please refer to the patient's chart for his  consent to telehealth for [facility].   The patient was identified using 2 identifiers.  Date:  09/27/2019   ID:  Brad Randolph, DOB 04-Dec-1975, MRN [id number]  Patient Location: Home Provider Location: Office  PCP:  Soyla Dryer, PA-C  Cardiologist:  Carlyle Dolly, MD  Electrophysiologist:  None   Evaluation Performed:  Follow-Up Visit  Chief Complaint:  Follow up visit  History of Present Illness:    Brad Randolph is a 44 y.o. male seen today for follow up of the following medical proble.s    1. Chest pain/CAD -12/2015 admitted to [facility] with NSTEMI, received DES x 2 to RCA  - 08/2017 echo LVEF 55-60% 03/2018 nuclear stress test: no ischemia  - no recent chest pain, has done well since starting imdur, prior symptoms resolved.  - no SOB or DOE - compliant withmeds   2. HTN - compliant with meds   3. Hyperlipidemia 08/2019 TC 89 TG 46 LDL 52 HDL 28 - compliant with statin   Had first covid shot, next one coming up in 1 month  The patient does not have symptoms concerning for COVID-19 infection (fever, chills, cough, or new shortness of breath).    Past Medical History:  Diagnosis Date  .  Anxiety   . Asthma    chilldhood  . Bipolar disorder ([place])   . Borderline personality disorder ([place])   . CAD (coronary artery disease)    a. s/p DESx2 to RCA in 12/2015 b. low-risk NST in 03/2018  . Depression   . Heart attack ([place])   . Hyperlipidemia   . Hypertension   . Migraine   . Seizures ([place]) 2018   last sz 12/2017   Past Surgical History:  Procedure Laterality Date  . CORONARY ANGIOPLASTY WITH STENT PLACEMENT       No outpatient medications have been marked as taking for the 09/27/19 encounter (Appointment) with [name], MD.     Allergies:   Soap, Tramadol, Mushroom extract complex, Other, and Trazodone and nefazodone   Social History   Tobacco Use  . Smoking status: Current Every Day Smoker    Packs/day: 0.25    Years: 33.00    Pack years: 8.25  . Smokeless tobacco: Never Used  Substance Use Topics  . Alcohol use: Not Currently    Comment: previously drunk socially  . 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  ROS:   Please see the history of present illness.     All other systems reviewed and are negative.   Prior CV studies:   The following studies were reviewed today:  08/2017 echo SUMMARY The left ventricular size is normal.  Left ventricular systolic function is normal. LV ejection fraction = 55-60%.  Left ventricular filling pattern is normal. The right ventricle is normal in size and function. There is no significant valvular stenosis or regurgitation IVC size was mildly dilated. There is no pericardial effusion. There is no significant change in  comparison with the last study.  01/2016 cath RESULTS:   Coronary arteriograms 1. Left main-normal 2. Left anterior descending-normal; D1-normal; D2-normal 3. Left circumflex-normal; OM1-normal; OM 2-normal 4. Right coronary-90% proximal at the takeoff of the RV marginal Runa Whitting[place]; 70% mid stenosis; PDA-normal  Left ventriculography 1. Normal left ventricular chamber size. 2. Normal wall motion. 3. The estimated EF is 55-60%.   CONCLUSIONS:  1. Single vessel native coronary artery disease. 2. Successful PCI with placement of 2 drug-eluting stents in the proximal and mid RCA. 3. Normal left ventricular chamber size with normal wall motion, the estimated EF is 55-60%.     03/2018 nuclear stress  There was no ST segment deviation noted during stress.  The study is normal. There are no perfusion defects consistent with prior infarct or current ischemia.  This is a low risk study.  The left ventricular ejection fraction is normal (55-65%).  Labs/Other Tests and Data Reviewed:    EKG:  No ECG reviewed.  Recent Labs: 08/11/2019: ALT 30; BUN 8; Creatinine, Ser 0.94; Potassium 4.2; Sodium 137   Recent Lipid Panel Lab Results  Component Value Date/Time   CHOL 89 08/11/2019 09:05 AM   TRIG 46 08/11/2019 09:05 AM   HDL 28 (L) 08/11/2019 09:05 AM   CHOLHDL 3.2 08/11/2019 09:05 AM   LDLCALC 52 08/11/2019 09:05 AM    Wt Readings from Last 3 Encounters:  08/30/19 195 lb (88.5 kg)  08/16/19 193 lb 6.4 oz (87.7 kg)  02/01/19 198 lb (89.8 kg)     Objective:    Vital Signs:   Today's Vitals   09/27/19 0935  BP: 110/80  Weight: 200 lb (90.7 kg)  Height: 5' 7"  (1.702 m)   Body mass index is 31.32 kg/m.  Normal affect. Normal speech pattern and tone. Comfortable no apparent distress. No audible signs of SOB or wheezing.   ASSESSMENT & PLAN:    1. 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    2. HTN - at goal, continue current meds   3. Hyperlipidemia - LDL is at goal, continue statin. Discussed lifestyle modifications to improve HDL  COVID-19 Education: The signs and symptoms of COVID-19 were discussed with the patient and how to seek care for testing (follow up with PCP or arrange E-visit).  The importance of social distancing was discussed today.  Time:   Today, I have spent 22 minutes with the patient with telehealth technology discussing the above problems.     Medication Adjustments/Labs and Tests Ordered: Current medicines are reviewed at length with the patient today.  Concerns regarding medicines are outlined above.   Tests Ordered: No orders of the defined types were placed in this encounter.   Medication Changes: No orders of the defined types were placed in this encounter.   Follow Up:  In Person in 1 year(s)  Signed, Carlyle Dolly, MD  09/27/2019 8:33 AM    [place]

## 2019-09-27 NOTE — Patient Instructions (Signed)
Medication Instructions:  Your physician recommends that you continue on your current medications as directed. Please refer to the Current Medication list given to you today.  *If you need a refill on your cardiac medications before your next appointment, please call your pharmacy*   Lab Work: NONE   If you have labs (blood work) drawn today and your tests are completely normal, you will receive your results only by: Marland Kitchen MyChart Message (if you have MyChart) OR . A paper copy in the mail If you have any lab test that is abnormal or we need to change your treatment, we will call you to review the results.   Testing/Procedures: NONE    Follow-Up: At [facility], you and your health needs are our priority.  As part of our continuing mission to provide you with exceptional heart care, we have created designated Provider Care Teams.  These Care Teams include your primary Cardiologist (physician) and Advanced Practice Providers (APPs -  Physician Assistants and Nurse Practitioners) who all work together to provide you with the care you need, when you need it.  We recommend signing up for the patient portal called "MyChart".  Sign up information is provided on this After Visit Summary.  MyChart is used to connect with patients for Virtual Visits (Telemedicine).  Patients are able to view lab/test results, encounter notes, upcoming appointments, etc.  Non-urgent messages can be sent to your provider as well.   To learn more about what you can do with MyChart, go to [email or web address].    Your next appointment:   1 year(s)  The format for your next appointment:   In Person  Provider:   Carlyle Dolly, MD   Other Instructions Thank you for choosing [facility]!

## 2019-10-12 ENCOUNTER — Other Ambulatory Visit: Payer: Self-pay | Admitting: Physician Assistant

## 2019-10-20 ENCOUNTER — Ambulatory Visit: Payer: Self-pay | Attending: Internal Medicine

## 2019-10-20 DIAGNOSIS — Z23 Encounter for immunization: Secondary | ICD-10-CM

## 2019-10-20 NOTE — Progress Notes (Signed)
   Covid-19 Vaccination Clinic  Name:  Sanay Belmar    MRN: [id number] DOB: 1976-06-04  10/20/2019  Mr. Cislo was observed post Covid-19 immunization for 15 minutes without incident. He was provided with Vaccine Information Sheet and instruction to access the V-Safe system.   Mr. Klingensmith was instructed to call 911 with any severe reactions post vaccine: Marland Kitchen Difficulty breathing  . Swelling of face and throat  . A fast heartbeat  . A bad rash all over body  . Dizziness and weakness   Immunizations Administered    Name Date Dose VIS Date Route   Moderna COVID-19 Vaccine 10/20/2019  9:24 AM 0.5 mL 06/2019 Intramuscular   Manufacturer: Levan Hurst   Lot: [id number]   [place]: [id number]      Covid-19 Vaccination Clinic  Name:  Axel Frisk    MRN: [id number] DOB: 04/01/1976  10/20/2019  Mr. Snare was observed post Covid-19 immunization for 15 minutes without incident. He was provided with Vaccine Information Sheet and instruction to access the V-Safe system.   Mr. Dubin was instructed to call 911 with any severe reactions post vaccine: Marland Kitchen Difficulty breathing  . Swelling of face and throat  . A fast heartbeat  . A bad rash all over body  . Dizziness and weakness   Immunizations Administered    Name Date Dose VIS Date Route   Moderna COVID-19 Vaccine 10/20/2019  9:24 AM 0.5 mL 06/2019 Intramuscular   Manufacturer: Moderna   Lot: [id number]   [place]: [id number]

## 2019-11-13 ENCOUNTER — Other Ambulatory Visit: Payer: Self-pay | Admitting: Diagnostic Neuroimaging

## 2020-01-17 ENCOUNTER — Other Ambulatory Visit: Payer: Self-pay | Admitting: Physician Assistant

## 2020-02-07 ENCOUNTER — Other Ambulatory Visit: Payer: Self-pay | Admitting: Physician Assistant

## 2020-02-07 DIAGNOSIS — E785 Hyperlipidemia, unspecified: Secondary | ICD-10-CM

## 2020-02-07 DIAGNOSIS — I10 Essential (primary) hypertension: Secondary | ICD-10-CM

## 2020-02-08 ENCOUNTER — Other Ambulatory Visit (HOSPITAL_COMMUNITY)
Admission: RE | Admit: 2020-02-08 | Discharge: 2020-02-08 | Disposition: A | Discharge status: Home / Self Care | Payer: Self-pay | Source: Ambulatory Visit | Attending: Physician Assistant | Admitting: Physician Assistant

## 2020-02-08 DIAGNOSIS — I10 Essential (primary) hypertension: Secondary | ICD-10-CM

## 2020-02-08 DIAGNOSIS — E785 Hyperlipidemia, unspecified: Secondary | ICD-10-CM

## 2020-02-08 LAB — LIPID PANEL
Cholesterol: 99 mg/dL (ref 0–200)
HDL: 36 mg/dL — ABNORMAL LOW (ref >40)
LDL Cholesterol: 55 mg/dL (ref 0–99)
Total CHOL/HDL Ratio: 2.8 RATIO
Triglycerides: 39 mg/dL (ref <150)
VLDL: 8 mg/dL (ref 0–40)

## 2020-02-08 LAB — COMPREHENSIVE METABOLIC PANEL
ALT: 32 U/L (ref 0–44)
AST: 25 U/L (ref 15–41)
Albumin: 4 g/dL (ref 3.5–5.0)
Alkaline Phosphatase: 52 U/L (ref 38–126)
Anion gap: 7 (ref 5–15)
BUN: 13 mg/dL (ref 6–20)
CO2: 24 mmol/L (ref 22–32)
Calcium: 9 mg/dL (ref 8.9–10.3)
Chloride: 108 mmol/L (ref 98–111)
Creatinine, Ser: 0.94 mg/dL (ref 0.61–1.24)
GFR calc Af Amer: >60 mL/min (ref >60)
GFR calc non Af Amer: >60 mL/min (ref >60)
Glucose, Bld: 110 mg/dL — ABNORMAL HIGH (ref 70–99)
Potassium: 4 mmol/L (ref 3.5–5.1)
Sodium: 139 mmol/L (ref 135–145)
Total Bilirubin: 1.2 mg/dL (ref 0.3–1.2)
Total Protein: 6.9 g/dL (ref 6.5–8.1)

## 2020-02-10 ENCOUNTER — Other Ambulatory Visit: Payer: Self-pay

## 2020-02-10 ENCOUNTER — Emergency Department (HOSPITAL_COMMUNITY)
Admission: EM | Admit: 2020-02-10 | Discharge: 2020-02-10 | Disposition: A | Discharge status: Home / Self Care | Payer: Self-pay | Attending: Emergency Medicine | Admitting: Emergency Medicine

## 2020-02-10 ENCOUNTER — Emergency Department (HOSPITAL_COMMUNITY): Payer: Self-pay

## 2020-02-10 ENCOUNTER — Encounter (HOSPITAL_COMMUNITY): Payer: Self-pay | Admitting: *Deleted

## 2020-02-10 DIAGNOSIS — I119 Hypertensive heart disease without heart failure: Secondary | ICD-10-CM | POA: Insufficient documentation

## 2020-02-10 DIAGNOSIS — Z79899 Other long term (current) drug therapy: Secondary | ICD-10-CM | POA: Insufficient documentation

## 2020-02-10 DIAGNOSIS — F17210 Nicotine dependence, cigarettes, uncomplicated: Secondary | ICD-10-CM | POA: Insufficient documentation

## 2020-02-10 DIAGNOSIS — R519 Headache, unspecified: Secondary | ICD-10-CM | POA: Insufficient documentation

## 2020-02-10 DIAGNOSIS — J45909 Unspecified asthma, uncomplicated: Secondary | ICD-10-CM | POA: Insufficient documentation

## 2020-02-10 DIAGNOSIS — Z955 Presence of coronary angioplasty implant and graft: Secondary | ICD-10-CM | POA: Insufficient documentation

## 2020-02-10 DIAGNOSIS — Z7982 Long term (current) use of aspirin: Secondary | ICD-10-CM | POA: Insufficient documentation

## 2020-02-10 LAB — COMPREHENSIVE METABOLIC PANEL
ALT: 29 U/L (ref 0–44)
AST: 21 U/L (ref 15–41)
Albumin: 4 g/dL (ref 3.5–5.0)
Alkaline Phosphatase: 49 U/L (ref 38–126)
Anion gap: 9 (ref 5–15)
BUN: 12 mg/dL (ref 6–20)
CO2: 26 mmol/L (ref 22–32)
Calcium: 8.9 mg/dL (ref 8.9–10.3)
Chloride: 105 mmol/L (ref 98–111)
Creatinine, Ser: 0.93 mg/dL (ref 0.61–1.24)
GFR calc Af Amer: >60 mL/min (ref >60)
GFR calc non Af Amer: >60 mL/min (ref >60)
Glucose, Bld: 106 mg/dL — ABNORMAL HIGH (ref 70–99)
Potassium: 3.7 mmol/L (ref 3.5–5.1)
Sodium: 140 mmol/L (ref 135–145)
Total Bilirubin: 1.5 mg/dL — ABNORMAL HIGH (ref 0.3–1.2)
Total Protein: 7.3 g/dL (ref 6.5–8.1)

## 2020-02-10 LAB — CBC WITH DIFFERENTIAL/PLATELET
Abs Immature Granulocytes: 0.03 10*3/uL (ref 0.00–0.07)
Basophils Absolute: 0.1 10*3/uL (ref 0.0–0.1)
Basophils Relative: 1 %
Eosinophils Absolute: 0.2 10*3/uL (ref 0.0–0.5)
Eosinophils Relative: 2 %
HCT: 42.1 % (ref 39.0–52.0)
Hemoglobin: 13.8 g/dL (ref 13.0–17.0)
Immature Granulocytes: 0 %
Lymphocytes Relative: 34 %
Lymphs Abs: 3 10*3/uL (ref 0.7–4.0)
MCH: 31.2 pg (ref 26.0–34.0)
MCHC: 32.8 g/dL (ref 30.0–36.0)
MCV: 95 fL (ref 80.0–100.0)
Monocytes Absolute: 0.7 10*3/uL (ref 0.1–1.0)
Monocytes Relative: 8 %
Neutro Abs: 4.8 10*3/uL (ref 1.7–7.7)
Neutrophils Relative %: 55 %
Platelets: 166 10*3/uL (ref 150–400)
RBC: 4.43 MIL/uL (ref 4.22–5.81)
RDW: 12.4 % (ref 11.5–15.5)
WBC: 8.8 10*3/uL (ref 4.0–10.5)
nRBC: 0 % (ref 0.0–0.2)

## 2020-02-10 MED ORDER — METOCLOPRAMIDE HCL 5 MG/ML IJ SOLN
10.0000 mg | Freq: Once | INTRAMUSCULAR | Status: AC
  Administered 2020-02-10: 10 mg via INTRAVENOUS
  Filled 2020-02-10: qty 2

## 2020-02-10 MED ORDER — SODIUM CHLORIDE 0.9 % IV BOLUS
1000.0000 mL | Freq: Once | INTRAVENOUS | Status: AC
  Administered 2020-02-10: 1000 mL via INTRAVENOUS

## 2020-02-10 MED ORDER — HYDROMORPHONE HCL 1 MG/ML IJ SOLN
0.5000 mg | Freq: Once | INTRAMUSCULAR | Status: AC
  Administered 2020-02-10: 0.5 mg via INTRAVENOUS
  Filled 2020-02-10: qty 1

## 2020-02-10 MED ORDER — DIPHENHYDRAMINE HCL 50 MG/ML IJ SOLN
12.5000 mg | Freq: Once | INTRAMUSCULAR | Status: AC
  Administered 2020-02-10: 12.5 mg via INTRAVENOUS
  Filled 2020-02-10: qty 1

## 2020-02-10 MED ORDER — IOHEXOL 350 MG/ML SOLN
75.0000 mL | Freq: Once | INTRAVENOUS | Status: AC | PRN
  Administered 2020-02-10: 75 mL via INTRAVENOUS

## 2020-02-10 NOTE — ED Provider Notes (Signed)
[facility] Provider Note   CSN: [id number] Arrival date & time: 02/10/20  1328     History Chief Complaint  Patient presents with   Headache    Brad Randolph is a 44 y.o. male.  The history is provided by the patient. No language interpreter was used.  Headache Pain location:  Generalized Radiates to:  Does not radiate Severity at highest:  9/10 Onset quality:  Gradual Timing:  Constant Chronicity:  Recurrent Similar to prior headaches: yes   Relieved by:  Nothing Worsened by:  Nothing Associated symptoms: no eye pain and no facial pain   Risk factors: anger    Pt has a history of an intercranial aneurysm, hypertension and  CAD. Pt reports he came in because this headache feels different than previous migraines     Past Medical History:  Diagnosis Date   Anxiety    Asthma    chilldhood   Bipolar disorder ([place])    Borderline personality disorder ([place])    CAD (coronary artery disease)    a. s/p DESx2 to RCA in 12/2015 b. 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/17/2018   Migraine with aura and without status migrainosus, not intractable 04/17/2018   Abnormal CT of brain 04/17/2018   Borderline personality disorder ([place]) 04/17/2018   Bipolar affective disorder, current episode depressed ([place]) 04/17/2018   CAD S/P percutaneous coronary angioplasty 03/18/2018   Essential hypertension 03/18/2018   Intracranial aneurysm 03/18/2018   History of MI (myocardial infarction) 03/18/2018    Past Surgical History:  Procedure Laterality Date   CORONARY ANGIOPLASTY WITH STENT PLACEMENT         Family History  Problem Relation Age of Onset   Diabetes Mother    Hypertension Mother    Diabetes Sister     Cancer Sister    Diabetes Maternal Aunt    Cancer Paternal Aunt    Diabetes Maternal Grandmother    Hypertension Maternal Grandmother    Hypertension Maternal Grandfather    Cancer Maternal Grandfather        lung cancer   Hypertension Paternal Grandmother    Heart disease Father    Diabetes Father     Social History   Tobacco Use   Smoking status: Current Every Day Smoker    Packs/day: 0.25    Years: 33.00    Pack years: 8.25    Types: Cigarettes   Smokeless tobacco: Never Used  Scientific laboratory technician Use: Never used  Substance Use Topics   Alcohol use: Not Currently    Comment: previously drunk socially   Drug use: Not Currently    Types: Marijuana, Methamphetamines    Comment: last use 2015. 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 Authorizing Provider  amLODipine (NORVASC) 5 MG tablet TAKE 1 Tablet BY MOUTH ONCE DAILY 04/08/19  Yes [name], MD  ASPIRIN ADULT LOW STRENGTH 81 MG EC tablet TAKE 1 Tablet BY MOUTH ONCE DAILY 07/07/19  Yes [name], MD  atorvastatin (LIPITOR) 80 MG tablet TAKE 1/2 Tablet BY MOUTH EVERY DAY 03/05/19  Yes [name],  MD  hydrOXYzine (VISTARIL) 25 MG capsule Take 25 mg by mouth daily as needed for anxiety.   Yes [provider]  isosorbide mononitrate (IMDUR) 30 MG 24 hr tablet TAKE 1 Tablet BY MOUTH ONCE DAILY 07/07/19  Yes Branch, Alphonse Guild, MD  lidocaine (LIDODERM) 5 % Place 1 patch onto the skin daily. Remove & Discard patch within 12 hours or as directed by MD 08/30/19  Yes Henderly, Britni A, PA-C  metoprolol tartrate (LOPRESSOR) 25 MG tablet TAKE 1 Tablet  BY MOUTH TWICE DAILY 01/17/20  Yes Soyla Dryer, PA-C  nitroGLYCERIN (NITROSTAT) 0.4 MG SL tablet Place 1 tablet (0.4 mg total) under the tongue every 5 (five) minutes as needed for chest pain. 03/18/18  Yes Soyla Dryer, PA-C  Paliperidone Palmitate ER (INVEGA  SUSTENNA) 117 MG/0.75ML SUSY Inject into the muscle every 30 (thirty) days.   Yes [provider]  PROVENTIL HFA 108 (90 Base) MCG/ACT inhaler INHALE 2 PUFFS BY MOUTH EVERY 6 HOURS AS NEEDED FOR COUGHING, WHEEZING, OR SHORTNESS OF BREATH Patient taking differently: Inhale 2 puffs into the lungs every 6 (six) hours as needed for wheezing or shortness of breath (coughing).  01/17/20  Yes Soyla Dryer, PA-C  topiramate (TOPAMAX) 25 MG tablet Take 25 mg by mouth 2 (two) times daily as needed (Headache).   Yes [provider]  cyclobenzaprine (FLEXERIL) 10 MG tablet Take 1 tablet (10 mg total) by mouth 2 (two) times daily as needed for muscle spasms. 08/30/19   Henderly, Britni A, PA-C  gabapentin (NEURONTIN) 300 MG capsule Take 1 capsule (300 mg total) by mouth 2 (two) times daily. Patient taking differently: Take 300 mg by mouth 2 (two) times daily as needed.  10/05/18   Penumalli, Earlean Polka, MD  HYDROcodone-acetaminophen (NORCO/VICODIN) 5-325 MG tablet Take 1 tablet by mouth every 6 (six) hours as needed. 08/30/19   Henderly, Britni A, PA-C  naproxen (NAPROSYN) 500 MG tablet Take 1 tablet (500 mg total) by mouth 2 (two) times daily. 08/30/19   Henderly, Britni A, PA-C    Allergies    Soap, Tramadol, Mushroom extract complex, Other, and Trazodone and nefazodone  Review of Systems   Review of Systems  Eyes: Negative for pain.  Neurological: Positive for headaches.  All other systems reviewed and are negative.   Physical Exam Updated Vital Signs BP 125/82 (BP Location: Right Arm)    Pulse 81    Temp 98.2 F (36.8 C) (Oral)    Resp 18    Ht 5' 7"  (1.702 m)    Wt 84.4 kg    SpO2 94%    BMI 29.13 kg/m   Physical Exam Vitals and nursing note reviewed.  Constitutional:      Appearance: He is well-developed.  HENT:     Head: Normocephalic and atraumatic.  Eyes:     Extraocular Movements: Extraocular movements intact.     Conjunctiva/sclera: Conjunctivae normal.  Cardiovascular:      Rate and Rhythm: Normal rate and regular rhythm.     Heart sounds: No murmur heard.   Pulmonary:     Effort: Pulmonary effort is normal. No respiratory distress.     Breath sounds: Normal breath sounds.  Abdominal:     Palpations: Abdomen is soft.     Tenderness: There is no abdominal tenderness.  Musculoskeletal:     Cervical back: Normal range of motion and neck supple.  Skin:    General: Skin is warm and dry.  Neurological:     Mental  Status: He is alert and oriented to person, place, and time.     Cranial Nerves: No cranial nerve deficit.     Sensory: No sensory deficit.     Deep Tendon Reflexes: Reflexes normal.     Comments: Left grip may be slightly decreased  Psychiatric:        Mood and Affect: Mood normal.     ED Results / Procedures / Treatments   Labs (all labs ordered are listed, but only abnormal results are displayed) Labs Reviewed  COMPREHENSIVE METABOLIC PANEL - Abnormal; Notable for the following components:      Result Value   Glucose, Bld 106 (*)    Total Bilirubin 1.5 (*)    All other components within normal limits  CBC WITH DIFFERENTIAL/PLATELET    EKG None  Radiology No results found.  Procedures Procedures (including critical care time)  Medications Ordered in ED Medications  HYDROmorphone (DILAUDID) injection 0.5 mg (has no administration in time range)  metoCLOPramide (REGLAN) injection 10 mg (has no administration in time range)  diphenhydrAMINE (BENADRYL) injection 12.5 mg (has no administration in time range)  sodium chloride 0.9 % bolus 1,000 mL (1,000 mLs Intravenous New Bag/Given 02/10/20 1928)  iohexol (OMNIPAQUE) 350 MG/ML injection 75 mL (75 mLs Intravenous Contrast Given 02/10/20 1938)    ED Course  I have reviewed the triage vital signs and the nursing notes.  Pertinent labs & imaging results that were available during my care of the patient were reviewed by me and considered in my medical decision making (see chart for  details).    MDM Rules/Calculators/A&P                          MDM: old records reviewed,  Ct scan is stable.  Final Clinical Impression(s) / ED Diagnoses Final diagnoses:  Bad headache    Rx / DC Orders ED Discharge Orders    None    Follow up with your Physician for recheck.  Return if any problems.    Sidney Ace 02/10/20 2018    Davonna Belling, MD 02/10/20 [phone]

## 2020-02-10 NOTE — ED Triage Notes (Signed)
Pt with HA since waking up at 0600 this morning, c/o tingling to bilateral hands and feet.  Pt states he has had the tingling before in the past.  Pt ambulated to triage with c/o of dizziness as well.

## 2020-02-14 ENCOUNTER — Encounter: Payer: Self-pay | Admitting: Physician Assistant

## 2020-02-14 ENCOUNTER — Ambulatory Visit: Payer: Self-pay | Admitting: Physician Assistant

## 2020-02-14 VITALS — BP 130/70 | HR 80 | Temp 98.1°F | Ht 67.25 in | Wt 186.8 lb

## 2020-02-14 DIAGNOSIS — F603 Borderline personality disorder: Secondary | ICD-10-CM

## 2020-02-14 DIAGNOSIS — F17200 Nicotine dependence, unspecified, uncomplicated: Secondary | ICD-10-CM

## 2020-02-14 DIAGNOSIS — I10 Essential (primary) hypertension: Secondary | ICD-10-CM

## 2020-02-14 DIAGNOSIS — I671 Cerebral aneurysm, nonruptured: Secondary | ICD-10-CM

## 2020-02-14 DIAGNOSIS — E785 Hyperlipidemia, unspecified: Secondary | ICD-10-CM

## 2020-02-14 DIAGNOSIS — F3130 Bipolar disorder, current episode depressed, mild or moderate severity, unspecified: Secondary | ICD-10-CM

## 2020-02-14 DIAGNOSIS — Z23 Encounter for immunization: Secondary | ICD-10-CM

## 2020-02-14 DIAGNOSIS — I2510 Atherosclerotic heart disease of native coronary artery without angina pectoris: Secondary | ICD-10-CM

## 2020-02-14 NOTE — Patient Instructions (Signed)
Dr Leta Baptist - [phone]

## 2020-02-14 NOTE — Progress Notes (Signed)
BP 130/70   Pulse 80   Temp 98.1 F (36.7 C)   Ht 5' 7.25" (1.708 m)   Wt 186 lb 12 oz (84.7 kg)   SpO2 97%   BMI 29.03 kg/m    Subjective:    Patient ID: Brad Randolph, male    DOB: [birth year], 44 y.o.   MRN: [id number]  HPI: Brad Randolph is a 44 y.o. male presenting on 02/14/2020 for Hyperlipidemia and Hypertension   HPI   Pt had a negative covid 19 screening questionnaire.    Pt Got covid vaccination  He says he is Having a lot of HA lately.  He Went to ER last Thursday.  CT angio normal.  He says he Gets them once or more often each week.  Sometimes one will carry over from one day to the next.    He takes APAP but says it doesn't help.  Pt apparently failed topamax and was supposed to follow up with Dr Leta Baptist who was treating pt for HA.  Pt was a no-show to his neurology follow-up November 2020.  He does not work  Pt still going to [employer] for [place] issues  Pt denies CP or SOB.    He sees cardiologist periodically for CAD with history of NSTEMI with DES to RCA.       Relevant past medical, surgical, family and social history reviewed and updated as indicated. Interim medical history since our last visit reviewed. Allergies and medications reviewed and updated.   Current Outpatient Medications:  .  amLODipine (NORVASC) 5 MG tablet, TAKE 1 Tablet BY MOUTH ONCE DAILY, Disp: 90 tablet, Rfl: 3 .  ASPIRIN ADULT LOW STRENGTH 81 MG EC tablet, TAKE 1 Tablet BY MOUTH ONCE DAILY, Disp: 90 tablet, Rfl: 3 .  atorvastatin (LIPITOR) 80 MG tablet, TAKE 1/2 Tablet BY MOUTH EVERY DAY, Disp: 45 tablet, Rfl: 3 .  hydrOXYzine (VISTARIL) 25 MG capsule, Take 25 mg by mouth daily as needed for anxiety., Disp: , Rfl:  .  isosorbide mononitrate (IMDUR) 30 MG 24 hr tablet, TAKE 1 Tablet BY MOUTH ONCE DAILY, Disp: 90 tablet, Rfl: 3 .  lidocaine (LIDODERM) 5 %, Place 1 patch onto the skin daily. Remove & Discard patch within 12 hours or as directed by MD, Disp: 30 patch, Rfl: 0 .  metoprolol  tartrate (LOPRESSOR) 25 MG tablet, TAKE 1 Tablet  BY MOUTH TWICE DAILY, Disp: 180 tablet, Rfl: 0 .  Paliperidone Palmitate ER (INVEGA SUSTENNA) 117 MG/0.75ML SUSY, Inject into the muscle every 30 (thirty) days., Disp: , Rfl:  .  PROVENTIL HFA 108 (90 Base) MCG/ACT inhaler, INHALE 2 PUFFS BY MOUTH EVERY 6 HOURS AS NEEDED FOR COUGHING, WHEEZING, OR SHORTNESS OF BREATH (Patient taking differently: Inhale 2 puffs into the lungs every 6 (six) hours as needed for wheezing or shortness of breath (coughing). ), Disp: 20.1 g, Rfl: 1 .  gabapentin (NEURONTIN) 300 MG capsule, Take 1 capsule (300 mg total) by mouth 2 (two) times daily. (Patient not taking: Reported on 02/14/2020), Disp: 60 capsule, Rfl: 12 .  naproxen (NAPROSYN) 500 MG tablet, Take 1 tablet (500 mg total) by mouth 2 (two) times daily. (Patient not taking: Reported on 02/14/2020), Disp: 30 tablet, Rfl: 0 .  nitroGLYCERIN (NITROSTAT) 0.4 MG SL tablet, Place 1 tablet (0.4 mg total) under the tongue every 5 (five) minutes as needed for chest pain. (Patient not taking: Reported on 02/14/2020), Disp: 25 tablet, Rfl: 1 .  topiramate (TOPAMAX) 25 MG tablet, Take 25 mg  by mouth 2 (two) times daily as needed (Headache). (Patient not taking: Reported on 02/14/2020), Disp: , Rfl:    Review of Systems  Per HPI unless specifically indicated above     Objective:    BP 130/70   Pulse 80   Temp 98.1 F (36.7 C)   Ht 5' 7.25" (1.708 m)   Wt 186 lb 12 oz (84.7 kg)   SpO2 97%   BMI 29.03 kg/m   Wt Readings from Last 3 Encounters:  02/14/20 186 lb 12 oz (84.7 kg)  02/10/20 186 lb (84.4 kg)  09/27/19 200 lb (90.7 kg)    Physical Exam Vitals reviewed.  Constitutional:      General: He is not in acute distress.    Appearance: He is well-developed. He is not ill-appearing.  HENT:     Head: Normocephalic and atraumatic.  Cardiovascular:     Rate and Rhythm: Normal rate and regular rhythm.  Pulmonary:     Effort: Pulmonary effort is normal.     Breath  sounds: Normal breath sounds. No wheezing.  Abdominal:     General: Bowel sounds are normal.     Palpations: Abdomen is soft.     Tenderness: There is no abdominal tenderness.  Musculoskeletal:     Cervical back: Neck supple.     Right lower leg: No edema.     Left lower leg: No edema.  Lymphadenopathy:     Cervical: No cervical adenopathy.  Skin:    General: Skin is warm and dry.  Neurological:     Mental Status: He is alert and oriented to person, place, and time.  Psychiatric:        Behavior: Behavior normal.          Assessment & Plan:     Encounter Diagnoses  Name Primary?  . Essential hypertension Yes  . Hyperlipidemia, unspecified hyperlipidemia type   . 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 Tobacco use disorder   . Borderline personality disorder ([place])   . Bipolar affective disorder, current episode depressed, current episode severity unspecified ([place])   . Need for Tdap vaccination   . Intracranial aneurysm       -Reviewed labs with pt.  Cholesterol is great -pt was given CAFA/application for [employer] financial assistance -Pt to call to r/s with dr Leta Baptist who he sees for his HA -Update Tdap in office today -pt to Continue current rx -encouraged smoking cessation -pt to follow up 3 months.  He is to contact office sooner prn

## 2020-03-23 ENCOUNTER — Other Ambulatory Visit: Payer: Self-pay | Admitting: Cardiology

## 2020-04-26 ENCOUNTER — Other Ambulatory Visit: Payer: Self-pay | Admitting: Physician Assistant

## 2020-04-26 DIAGNOSIS — E785 Hyperlipidemia, unspecified: Secondary | ICD-10-CM

## 2020-04-26 DIAGNOSIS — I10 Essential (primary) hypertension: Secondary | ICD-10-CM

## 2020-05-02 ENCOUNTER — Other Ambulatory Visit: Payer: Self-pay

## 2020-05-02 ENCOUNTER — Other Ambulatory Visit (HOSPITAL_COMMUNITY)
Admission: RE | Admit: 2020-05-02 | Discharge: 2020-05-02 | Disposition: A | Discharge status: Home / Self Care | Payer: Self-pay | Source: Ambulatory Visit | Attending: Physician Assistant | Admitting: Physician Assistant

## 2020-05-02 DIAGNOSIS — I10 Essential (primary) hypertension: Secondary | ICD-10-CM

## 2020-05-02 DIAGNOSIS — E785 Hyperlipidemia, unspecified: Secondary | ICD-10-CM

## 2020-05-02 LAB — COMPREHENSIVE METABOLIC PANEL
ALT: 42 U/L (ref 0–44)
AST: 29 U/L (ref 15–41)
Albumin: 4 g/dL (ref 3.5–5.0)
Alkaline Phosphatase: 51 U/L (ref 38–126)
Anion gap: 8 (ref 5–15)
BUN: 16 mg/dL (ref 6–20)
CO2: 23 mmol/L (ref 22–32)
Calcium: 9.1 mg/dL (ref 8.9–10.3)
Chloride: 106 mmol/L (ref 98–111)
Creatinine, Ser: 0.88 mg/dL (ref 0.61–1.24)
GFR, Estimated: >60 mL/min (ref >60)
Glucose, Bld: 141 mg/dL — ABNORMAL HIGH (ref 70–99)
Potassium: 4 mmol/L (ref 3.5–5.1)
Sodium: 137 mmol/L (ref 135–145)
Total Bilirubin: 1.3 mg/dL — ABNORMAL HIGH (ref 0.3–1.2)
Total Protein: 7.3 g/dL (ref 6.5–8.1)

## 2020-05-02 LAB — LIPID PANEL
Cholesterol: 105 mg/dL (ref 0–200)
HDL: 30 mg/dL — ABNORMAL LOW (ref >40)
LDL Cholesterol: 60 mg/dL (ref 0–99)
Total CHOL/HDL Ratio: 3.5 RATIO
Triglycerides: 75 mg/dL (ref <150)
VLDL: 15 mg/dL (ref 0–40)

## 2020-05-03 ENCOUNTER — Other Ambulatory Visit: Payer: Self-pay | Admitting: Cardiology

## 2020-05-03 ENCOUNTER — Other Ambulatory Visit: Payer: Self-pay | Admitting: Physician Assistant

## 2020-05-11 ENCOUNTER — Ambulatory Visit: Payer: Self-pay | Admitting: Physician Assistant

## 2020-05-11 ENCOUNTER — Encounter: Payer: Self-pay | Admitting: Physician Assistant

## 2020-05-11 VITALS — BP 90/66 | HR 94 | Temp 98.1°F | Ht 67.25 in | Wt 179.7 lb

## 2020-05-11 DIAGNOSIS — F603 Borderline personality disorder: Secondary | ICD-10-CM

## 2020-05-11 DIAGNOSIS — F17200 Nicotine dependence, unspecified, uncomplicated: Secondary | ICD-10-CM

## 2020-05-11 DIAGNOSIS — F3130 Bipolar disorder, current episode depressed, mild or moderate severity, unspecified: Secondary | ICD-10-CM

## 2020-05-11 DIAGNOSIS — E785 Hyperlipidemia, unspecified: Secondary | ICD-10-CM

## 2020-05-11 DIAGNOSIS — I2510 Atherosclerotic heart disease of native coronary artery without angina pectoris: Secondary | ICD-10-CM

## 2020-05-11 DIAGNOSIS — I10 Essential (primary) hypertension: Secondary | ICD-10-CM

## 2020-05-11 MED ORDER — METOPROLOL TARTRATE 25 MG PO TABS: 25.0000 mg | ORAL_TABLET | Freq: Two times a day (BID) | ORAL | 0 refills | Status: DC

## 2020-05-11 MED ORDER — AMLODIPINE BESYLATE 2.5 MG PO TABS: 2.5000 mg | ORAL_TABLET | Freq: Every day | ORAL | 1 refills | Status: DC

## 2020-05-11 MED ORDER — ATORVASTATIN CALCIUM 80 MG PO TABS: ORAL_TABLET | ORAL | 1 refills | Status: DC

## 2020-05-11 NOTE — Patient Instructions (Signed)

## 2020-05-11 NOTE — Progress Notes (Signed)
BP 90/66   Pulse 94   Temp 98.1 F (36.7 C)   Ht 5' 7.25" (1.708 m)   Wt 179 lb 11.2 oz (81.5 kg)   SpO2 96%   BMI 27.94 kg/m    Subjective:    Patient ID: Brad Randolph, male    DOB: Dec 31, 1975, 44 y.o.   MRN: [id number]  HPI: Brad Randolph is a 44 y.o. male presenting on 05/11/2020 for Hyperlipidemia and Hypertension   HPI    pt had a negative covid 19 screening questionnaire.    Pt is a 42yoM who presents for routine follow up.     He is Still going to [place] for [place] issues  He Is on a 1 year f/u with cardiology.  He is having no CP or sob.  He says Sometimes back hurts.  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  He walks for exercise daily for about 15 minutes.  He is Still smoking  He says he occassionally gets a little dizzy, maybe a Couple times/week.     Relevant past medical, surgical, family and social history reviewed and updated as indicated. Interim medical history since our last visit reviewed. Allergies and medications reviewed and updated.    Current Outpatient Medications:  .  amLODipine (NORVASC) 5 MG tablet, TAKE 1 Tablet BY MOUTH ONCE DAILY, Disp: 90 tablet, Rfl: 3 .  ASPIRIN ADULT LOW STRENGTH 81 MG EC tablet, TAKE 1 Tablet BY MOUTH ONCE DAILY, Disp: 90 tablet, Rfl: 3 .  atorvastatin (LIPITOR) 80 MG tablet, TAKE 1/2 Tablet BY MOUTH ONCE EVERY DAY, Disp: 45 tablet, Rfl: 3 .  hydrOXYzine (VISTARIL) 25 MG capsule, Take 25 mg by mouth daily as needed for anxiety., Disp: , Rfl:  .  isosorbide mononitrate (IMDUR) 30 MG 24 hr tablet, TAKE 1 Tablet BY MOUTH ONCE DAILY, Disp: 90 tablet, Rfl: 3 .  metoprolol tartrate (LOPRESSOR) 25 MG tablet, TAKE 1 Tablet  BY MOUTH TWICE DAILY, Disp: 180 tablet, Rfl: 0 .  Paliperidone Palmitate ER (INVEGA SUSTENNA) 117 MG/0.75ML SUSY, Inject into the muscle every 30 (thirty) days., Disp: , Rfl:  .  PROVENTIL HFA 108 (90 Base) MCG/ACT inhaler, INHALE 2 PUFFS BY MOUTH EVERY 6 HOURS AS NEEDED FOR COUGHING,  WHEEZING, OR SHORTNESS OF BREATH (Patient taking differently: Inhale 2 puffs into the lungs every 6 (six) hours as needed for wheezing or shortness of breath (coughing). ), Disp: 20.1 g, Rfl: 1 .  gabapentin (NEURONTIN) 300 MG capsule, Take 1 capsule (300 mg total) by mouth 2 (two) times daily. (Patient not taking: Reported on 02/14/2020), Disp: 60 capsule, Rfl: 12 .  lidocaine (LIDODERM) 5 %, Place 1 patch onto the skin daily. Remove & Discard patch within 12 hours or as directed by MD (Patient not taking: Reported on 05/11/2020), Disp: 30 patch, Rfl: 0 .  naproxen (NAPROSYN) 500 MG tablet, Take 1 tablet (500 mg total) by mouth 2 (two) times daily. (Patient not taking: Reported on 02/14/2020), Disp: 30 tablet, Rfl: 0 .  nitroGLYCERIN (NITROSTAT) 0.4 MG SL tablet, Place 1 tablet (0.4 mg total) under the tongue every 5 (five) minutes as needed for chest pain. (Patient not taking: Reported on 02/14/2020), Disp: 25 tablet, Rfl: 1 .  topiramate (TOPAMAX) 25 MG tablet, Take 25 mg by mouth 2 (two) times daily as needed (Headache). (Patient not taking: Reported on 02/14/2020), Disp: , Rfl:    Review of Systems  Per HPI unless specifically indicated above     Objective:  BP 90/66   Pulse 94   Temp 98.1 F (36.7 C)   Ht 5' 7.25" (1.708 m)   Wt 179 lb 11.2 oz (81.5 kg)   SpO2 96%   BMI 27.94 kg/m   Wt Readings from Last 3 Encounters:  05/11/20 179 lb 11.2 oz (81.5 kg)  02/14/20 186 lb 12 oz (84.7 kg)  02/10/20 186 lb (84.4 kg)    Physical Exam Vitals reviewed.  Constitutional:      General: He is not in acute distress.    Appearance: He is well-developed. He is not ill-appearing.  HENT:     Head: Normocephalic and atraumatic.  Cardiovascular:     Rate and Rhythm: Normal rate and regular rhythm.  Pulmonary:     Effort: Pulmonary effort is normal.     Breath sounds: Normal breath sounds. No wheezing.  Abdominal:     General: Bowel sounds are normal.     Palpations: Abdomen is soft.      Tenderness: There is no abdominal tenderness.  Musculoskeletal:     Cervical back: Neck supple.     Right lower leg: No edema.     Left lower leg: No edema.  Lymphadenopathy:     Cervical: No cervical adenopathy.  Skin:    General: Skin is warm and dry.  Neurological:     Mental Status: He is alert and oriented to person, place, and time.  Psychiatric:        Mood and Affect: Mood is anxious.        Behavior: Behavior is cooperative.     Comments: Pt is shaking legs during entire appointment (nervous-like).     Results for orders placed or performed during the hospital encounter of 05/02/20  Lipid panel  Result Value Ref Range   Cholesterol 105 0 - 200 mg/dL   Triglycerides 75 <150 mg/dL   HDL 30 (L) >40 mg/dL   Total CHOL/HDL Ratio 3.5 RATIO   VLDL 15 0 - 40 mg/dL   LDL Cholesterol 60 0 - 99 mg/dL  Comprehensive metabolic panel  Result Value Ref Range   Sodium 137 135 - 145 mmol/L   Potassium 4.0 3.5 - 5.1 mmol/L   Chloride 106 98 - 111 mmol/L   CO2 23 22 - 32 mmol/L   Glucose, Bld 141 (H) 70 - 99 mg/dL   BUN 16 6 - 20 mg/dL   Creatinine, Ser 0.88 0.61 - 1.24 mg/dL   Calcium 9.1 8.9 - 10.3 mg/dL   Total Protein 7.3 6.5 - 8.1 g/dL   Albumin 4.0 3.5 - 5.0 g/dL   AST 29 15 - 41 U/L   ALT 42 0 - 44 U/L   Alkaline Phosphatase 51 38 - 126 U/L   Total Bilirubin 1.3 (H) 0.3 - 1.2 mg/dL   GFR, Estimated >60 >60 mL/min   Anion gap 8 5 - 15      Assessment & Plan:    Encounter Diagnoses  Name Primary?  . Essential hypertension Yes  . Hyperlipidemia, unspecified hyperlipidemia type   . 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 Tobacco use disorder   . Borderline personality disorder ([place])   . Bipolar affective disorder, current episode depressed, current episode severity unspecified ([place])       -Reviewed labs with pt -Glucose a bit high.  a1c 5.2 last year -will Cut back amlodipine due to dizzy  and low bp -pt to follow up 3  months.  He is to contact office sooner prn

## 2020-06-29 ENCOUNTER — Emergency Department (HOSPITAL_BASED_OUTPATIENT_CLINIC_OR_DEPARTMENT_OTHER): Payer: Self-pay

## 2020-06-29 ENCOUNTER — Emergency Department (HOSPITAL_BASED_OUTPATIENT_CLINIC_OR_DEPARTMENT_OTHER)
Admission: EM | Admit: 2020-06-29 | Discharge: 2020-06-29 | Disposition: A | Discharge status: Home / Self Care | Payer: Self-pay | Attending: Emergency Medicine | Admitting: Emergency Medicine

## 2020-06-29 ENCOUNTER — Other Ambulatory Visit: Payer: Self-pay

## 2020-06-29 ENCOUNTER — Encounter (HOSPITAL_BASED_OUTPATIENT_CLINIC_OR_DEPARTMENT_OTHER): Payer: Self-pay

## 2020-06-29 DIAGNOSIS — Z7982 Long term (current) use of aspirin: Secondary | ICD-10-CM | POA: Insufficient documentation

## 2020-06-29 DIAGNOSIS — R079 Chest pain, unspecified: Secondary | ICD-10-CM

## 2020-06-29 DIAGNOSIS — M25512 Pain in left shoulder: Secondary | ICD-10-CM | POA: Insufficient documentation

## 2020-06-29 DIAGNOSIS — Z79899 Other long term (current) drug therapy: Secondary | ICD-10-CM | POA: Insufficient documentation

## 2020-06-29 DIAGNOSIS — F17210 Nicotine dependence, cigarettes, uncomplicated: Secondary | ICD-10-CM | POA: Insufficient documentation

## 2020-06-29 DIAGNOSIS — Z20822 Contact with and (suspected) exposure to covid-19: Secondary | ICD-10-CM | POA: Insufficient documentation

## 2020-06-29 DIAGNOSIS — I2510 Atherosclerotic heart disease of native coronary artery without angina pectoris: Secondary | ICD-10-CM | POA: Insufficient documentation

## 2020-06-29 DIAGNOSIS — R0789 Other chest pain: Secondary | ICD-10-CM | POA: Insufficient documentation

## 2020-06-29 DIAGNOSIS — I119 Hypertensive heart disease without heart failure: Secondary | ICD-10-CM | POA: Insufficient documentation

## 2020-06-29 DIAGNOSIS — J45909 Unspecified asthma, uncomplicated: Secondary | ICD-10-CM | POA: Insufficient documentation

## 2020-06-29 DIAGNOSIS — R0602 Shortness of breath: Secondary | ICD-10-CM | POA: Insufficient documentation

## 2020-06-29 LAB — CBC
HCT: 39.2 % (ref 39.0–52.0)
Hemoglobin: 13.4 g/dL (ref 13.0–17.0)
MCH: 31.5 pg (ref 26.0–34.0)
MCHC: 34.2 g/dL (ref 30.0–36.0)
MCV: 92 fL (ref 80.0–100.0)
Platelets: 177 10*3/uL (ref 150–400)
RBC: 4.26 MIL/uL (ref 4.22–5.81)
RDW: 12.6 % (ref 11.5–15.5)
WBC: 11.9 10*3/uL — ABNORMAL HIGH (ref 4.0–10.5)
nRBC: 0 % (ref 0.0–0.2)

## 2020-06-29 LAB — BASIC METABOLIC PANEL
Anion gap: 10 (ref 5–15)
BUN: 9 mg/dL (ref 6–20)
CO2: 21 mmol/L — ABNORMAL LOW (ref 22–32)
Calcium: 8.9 mg/dL (ref 8.9–10.3)
Chloride: 104 mmol/L (ref 98–111)
Creatinine, Ser: 0.88 mg/dL (ref 0.61–1.24)
GFR, Estimated: >60 mL/min (ref >60)
Glucose, Bld: 103 mg/dL — ABNORMAL HIGH (ref 70–99)
Potassium: 3.4 mmol/L — ABNORMAL LOW (ref 3.5–5.1)
Sodium: 135 mmol/L (ref 135–145)

## 2020-06-29 LAB — TROPONIN I (HIGH SENSITIVITY)
Troponin I (High Sensitivity): 4 ng/L (ref <18)
Troponin I (High Sensitivity): 7 ng/L (ref <18)

## 2020-06-29 MED ORDER — ASPIRIN 81 MG PO CHEW
324.0000 mg | CHEWABLE_TABLET | Freq: Once | ORAL | Status: AC
  Administered 2020-06-29: 22:00:00 324 mg via ORAL
  Filled 2020-06-29: qty 4

## 2020-06-29 NOTE — ED Notes (Signed)
ED Provider at bedside. 

## 2020-06-29 NOTE — ED Notes (Signed)
Assisted back into bed. Delay explained to patient. VSS and cycling. No further needs at this time.

## 2020-06-29 NOTE — ED Notes (Signed)
Pt ambulatory to bathroom by self.

## 2020-06-29 NOTE — ED Triage Notes (Signed)
Pt c/o intermittent CP x 3 years-worse x today-SOB and HA x 1 hour-denies fever/cough-NAD-steady gait

## 2020-06-29 NOTE — Discharge Instructions (Signed)
Recommend follow-up with cardiology on this matter. Call to make an appointment. Return to the emergency department for worsened symptoms, recurrent symptoms, or any other major concerns.

## 2020-06-29 NOTE — ED Provider Notes (Signed)
[place] EMERGENCY DEPARTMENT Provider Note   CSN: [id number] Arrival date & time: 06/29/20  1441     History Chief Complaint  Patient presents with  . Chest Pain    Brad Randolph is a 44 y.o. male.  HPI      Brad Randolph is a 44 y.o. male, with a history of hyperlipidemia, HTN, borderline personality, bipolar, anxiety, MI, presenting to the ED with chest pain beginning around 2 pm today while walking.  States pain was central, "felt like something was pushing out from the inside," was 8/10, now 5/10, radiating to left shoulder. 8/10 for the first hour, now "feels much better." Accompanied by shortness of breath.  "Doesn't feel like my heart attack." Denies alcohol or illicit drug use.    Denies fever/chills, cough, N/V/D, abdominal pain, acute back pain, lower extremity edema/pain, acute neurologic deficits, or any other complaints.     Past Medical History:  Diagnosis Date  . Anxiety   . Asthma    chilldhood  . Bipolar disorder ([place])   . Borderline personality disorder ([place])   . CAD (coronary artery disease)    a. s/p DESx2 to RCA in 12/2015 b. low-risk NST in 03/2018  . Depression   . Heart attack ([place])   . Hyperlipidemia   . Hypertension   . Migraine   . Seizures ([place]) 2018   last sz 12/2017    Patient Active Problem List   Diagnosis Date Noted  . Chest pain 06/25/2018  . 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 Migraine with aura and without status migrainosus, not intractable 04/17/2018  . Abnormal CT of brain 04/17/2018  . Borderline personality disorder ([place]) 04/17/2018  . Bipolar affective disorder, current episode depressed ([place]) 04/17/2018  . CAD S/P percutaneous coronary angioplasty 03/18/2018  . Essential hypertension 03/18/2018  . Intracranial aneurysm 03/18/2018  . History of MI (myocardial infarction) 03/18/2018    Past Surgical History:  Procedure Laterality Date  . CORONARY ANGIOPLASTY WITH STENT PLACEMENT          Family History  Problem Relation Age of Onset  . Diabetes Mother   . Hypertension Mother   . Diabetes Sister   . Cancer Sister   . Diabetes Maternal Aunt   . Cancer Paternal Aunt   . Diabetes Maternal Grandmother   . Hypertension Maternal Grandmother   . Hypertension Maternal Grandfather   . Cancer Maternal Grandfather        lung cancer  . Hypertension Paternal Grandmother   . Heart disease Father   . Diabetes Father     Social History   Tobacco Use  . Smoking status: Current Every Day Smoker    Packs/day: 0.25    Years: 33.00    Pack years: 8.25    Types: Cigarettes  . Smokeless tobacco: Never Used  Vaping Use  . Vaping Use: Never used  Substance Use Topics  . Alcohol use: Not Currently  . Drug use: Not Currently    Types: Marijuana, Methamphetamines    Home Medications Prior to Admission medications   Medication Sig Start Date End Date Taking? Authorizing Provider  amLODipine (NORVASC) 2.5 MG tablet Take 1 tablet (2.5 mg total) by mouth daily. 05/11/20   Soyla Dryer, PA-C  ASPIRIN ADULT LOW STRENGTH 81 MG EC tablet TAKE 1 Tablet BY MOUTH ONCE DAILY 07/07/19   [name], MD  atorvastatin (LIPITOR) 80 MG tablet TAKE 1/2 Tablet BY MOUTH ONCE EVERY DAY 05/11/20   Soyla Dryer, PA-C  gabapentin (NEURONTIN) 300 MG capsule Take 1 capsule (300 mg total) by mouth 2 (two) times daily. Patient not taking: Reported on 02/14/2020 10/05/18   Penumalli, Earlean Polka, MD  hydrOXYzine (VISTARIL) 25 MG capsule Take 25 mg by mouth daily as needed for anxiety.    [provider]  isosorbide mononitrate (IMDUR) 30 MG 24 hr tablet TAKE 1 Tablet BY MOUTH ONCE DAILY 07/07/19   [name], MD  lidocaine (LIDODERM) 5 % Place 1 patch onto the skin daily. Remove & Discard patch within 12 hours or as directed by MD Patient not taking: Reported on 05/11/2020 08/30/19   Henderly, Britni A, PA-C  metoprolol tartrate (LOPRESSOR) 25 MG tablet Take 1 tablet (25 mg  total) by mouth 2 (two) times daily. 05/11/20   Soyla Dryer, PA-C  naproxen (NAPROSYN) 500 MG tablet Take 1 tablet (500 mg total) by mouth 2 (two) times daily. Patient not taking: Reported on 02/14/2020 08/30/19   Henderly, Britni A, PA-C  nitroGLYCERIN (NITROSTAT) 0.4 MG SL tablet Place 1 tablet (0.4 mg total) under the tongue every 5 (five) minutes as needed for chest pain. Patient not taking: Reported on 02/14/2020 03/18/18   Soyla Dryer, PA-C  Paliperidone Palmitate ER (INVEGA SUSTENNA) 117 MG/0.75ML SUSY Inject into the muscle every 30 (thirty) days.    [provider]  PROVENTIL HFA 108 (90 Base) MCG/ACT inhaler INHALE 2 PUFFS BY MOUTH EVERY 6 HOURS AS NEEDED FOR COUGHING, WHEEZING, OR SHORTNESS OF BREATH Patient taking differently: Inhale 2 puffs into the lungs every 6 (six) hours as needed for wheezing or shortness of breath (coughing).  01/17/20   Soyla Dryer, PA-C  topiramate (TOPAMAX) 25 MG tablet Take 25 mg by mouth 2 (two) times daily as needed (Headache). Patient not taking: Reported on 02/14/2020    [provider]    Allergies    Soap, Tramadol, Mushroom extract complex, Other, and Trazodone and nefazodone  Review of Systems   Review of Systems  Constitutional: Negative for chills, diaphoresis and fever.  Respiratory: Positive for shortness of breath. Negative for cough.   Cardiovascular: Positive for chest pain. Negative for leg swelling.  Gastrointestinal: Negative for abdominal pain, diarrhea, nausea and vomiting.  Musculoskeletal: Negative for back pain.  Neurological: Negative for dizziness, syncope, weakness and numbness.  All other systems reviewed and are negative.   Physical Exam Updated Vital Signs BP 116/82   Pulse 67   Temp 98 F (36.7 C) (Oral)   Resp 20   Ht 5' 7"  (1.702 m)   Wt 83 kg   SpO2 100%   BMI 28.66 kg/m   Physical Exam Vitals and nursing note reviewed.  Constitutional:      General: He is not in acute  distress.    Appearance: He is well-developed. He is not diaphoretic.  HENT:     Head: Normocephalic and atraumatic.     Mouth/Throat:     Mouth: Mucous membranes are moist.     Pharynx: Oropharynx is clear.  Eyes:     Conjunctiva/sclera: Conjunctivae normal.  Cardiovascular:     Rate and Rhythm: Normal rate and regular rhythm.     Pulses: Normal pulses.          Radial pulses are 2+ on the right side and 2+ on the left side.       Posterior tibial pulses are 2+ on the right side and 2+ on the left side.     Heart sounds: Normal heart sounds.  Comments: Tactile temperature in the extremities appropriate and equal bilaterally. Pulmonary:     Effort: Pulmonary effort is normal. No respiratory distress.     Breath sounds: Normal breath sounds.  Abdominal:     Palpations: Abdomen is soft.     Tenderness: There is no abdominal tenderness. There is no guarding.  Musculoskeletal:     Cervical back: Neck supple.     Right lower leg: No edema.     Left lower leg: No edema.  Lymphadenopathy:     Cervical: No cervical adenopathy.  Skin:    General: Skin is warm and dry.  Neurological:     Mental Status: He is alert.     Comments: No noted acute cognitive deficit. Sensation grossly intact to light touch in the extremities.   Grip strengths equal bilaterally.   Strength 5/5 in all extremities.  No gait disturbance.  Coordination intact.  Cranial nerves III-XII grossly intact.  Handles oral secretions without noted difficulty.  No noted phonation or speech deficit. No facial droop.   Psychiatric:        Mood and Affect: Mood and affect normal.        Speech: Speech normal.        Behavior: Behavior normal.     ED Results / Procedures / Treatments   Labs (all labs ordered are listed, but only abnormal results are displayed) Labs Reviewed  BASIC METABOLIC PANEL - Abnormal; Notable for the following components:      Result Value   Potassium 3.4 (*)    CO2 21 (*)    Glucose,  Bld 103 (*)    All other components within normal limits  CBC - Abnormal; Notable for the following components:   WBC 11.9 (*)    All other components within normal limits  SARS CORONAVIRUS 2 (TAT 6-24 HRS)  TROPONIN I (HIGH SENSITIVITY)  TROPONIN I (HIGH SENSITIVITY)    EKG EKG Interpretation  Date/Time:  Thursday June 29 2020 14:53:49 EST Ventricular Rate:  100 PR Interval:  118 QRS Duration: 90 QT Interval:  336 QTC Calculation: 433 R Axis:   91 Text Interpretation: Normal sinus rhythm Rightward axis Borderline ECG When compared to prior, improved wandering baseline. No STEMI Confirmed by Antony Blackbird [phone]) on 06/29/2020 11:11:29 PM   Radiology DG Chest Portable 1 View  Result Date: 06/29/2020 CLINICAL DATA:  Chest pain, short of breath, headache EXAM: PORTABLE CHEST 1 VIEW COMPARISON:  07/31/2017 FINDINGS: The heart size and mediastinal contours are within normal limits. Both lungs are clear. The visualized skeletal structures are unremarkable. IMPRESSION: No active disease. Electronically Signed   By: Randa Ngo M.D.   On: 06/29/2020 16:30    Procedures Procedures (including critical care time)  Medications Ordered in ED Medications  aspirin chewable tablet 324 mg (324 mg Oral Given 06/29/20 2203)    ED Course  I have reviewed the triage vital signs and the nursing notes.  Pertinent labs & imaging results that were available during my care of the patient were reviewed by me and considered in my medical decision making (see chart for details).    MDM Rules/Calculators/A&P                          Patient presents with chest discomfort.  Throughout the time in the ED, patient's symptoms resolved and did not recur, even with exertion. Patient is nontoxic appearing, afebrile, not tachycardic, not tachypneic, not hypotensive, maintains excellent SPO2  on room air, and is in no apparent distress.   I have reviewed the patient's chart to obtain more  information.  I reviewed and interpreted the patient's labs and radiological studies.  EKG without evidence of acute ischemia or pathologic/symptomatic arrhythmia. Wells criteria score is 0, indicating low risk for PE.  PERC negative. Dissection was considered, but thought less likely base on: History and description of the pain are not suggestive, patient is not ill-appearing, lack of risk factors, equal bilateral pulses, lack of neurologic deficits, no widened mediastinum on chest x-ray.  No hypertension. All of these findings as well, as demonstration of stability throughout ED course, were discussed with the patient.  We discussed observation admission and consultation with cardiologist on-call versus discharge and follow-up with cardiology in the office.  Risks and benefits of each option discussed.  Patient voiced understanding of these options and opted towards discharge. Symptom-free at time of discharge.  Return precautions discussed.  Patient voices understanding of these instructions, accepts the plan, and is comfortable with discharge.   Findings and plan of care discussed with attending physician, Antony Blackbird, MD.   Vitals:   06/29/20 2100 06/29/20 2200 06/29/20 2300 06/29/20 2341  BP: 122/84 123/80 113/66 115/69  Pulse: 64 63 72 70  Resp: 20 16 16 16   Temp:      TempSrc:      SpO2: 99% 100% 100% 100%  Weight:      Height:         Final Clinical Impression(s) / ED Diagnoses Final diagnoses:  Central chest pain    Rx / DC Orders ED Discharge Orders    None       Layla Maw 06/30/20 0121    Tegeler, Gwenyth Allegra, MD 07/03/20 [phone]

## 2020-06-29 NOTE — ED Notes (Signed)
Discharge instructions discussed with patient. Verbalized understanding. Departs ED in stable condition at this time.

## 2020-06-30 LAB — SARS CORONAVIRUS 2 (TAT 6-24 HRS): SARS Coronavirus 2: NEGATIVE

## 2020-08-02 ENCOUNTER — Other Ambulatory Visit: Payer: Self-pay | Admitting: Physician Assistant

## 2020-08-02 DIAGNOSIS — R739 Hyperglycemia, unspecified: Secondary | ICD-10-CM

## 2020-08-02 DIAGNOSIS — I2510 Atherosclerotic heart disease of native coronary artery without angina pectoris: Secondary | ICD-10-CM

## 2020-08-02 DIAGNOSIS — I10 Essential (primary) hypertension: Secondary | ICD-10-CM

## 2020-08-02 DIAGNOSIS — E785 Hyperlipidemia, unspecified: Secondary | ICD-10-CM

## 2020-08-02 DIAGNOSIS — Z131 Encounter for screening for diabetes mellitus: Secondary | ICD-10-CM

## 2020-08-04 ENCOUNTER — Other Ambulatory Visit: Payer: Self-pay | Admitting: Cardiology

## 2020-08-04 ENCOUNTER — Other Ambulatory Visit: Payer: Self-pay | Admitting: Physician Assistant

## 2020-08-16 ENCOUNTER — Ambulatory Visit: Payer: Self-pay | Admitting: Physician Assistant

## 2020-08-16 ENCOUNTER — Encounter: Payer: Self-pay | Admitting: Physician Assistant

## 2020-08-16 ENCOUNTER — Other Ambulatory Visit: Payer: Self-pay

## 2020-08-16 VITALS — BP 108/64 | HR 77 | Temp 98.2°F

## 2020-08-16 DIAGNOSIS — E785 Hyperlipidemia, unspecified: Secondary | ICD-10-CM

## 2020-08-16 DIAGNOSIS — F3130 Bipolar disorder, current episode depressed, mild or moderate severity, unspecified: Secondary | ICD-10-CM

## 2020-08-16 DIAGNOSIS — F17200 Nicotine dependence, unspecified, uncomplicated: Secondary | ICD-10-CM

## 2020-08-16 DIAGNOSIS — I2510 Atherosclerotic heart disease of native coronary artery without angina pectoris: Secondary | ICD-10-CM

## 2020-08-16 DIAGNOSIS — I10 Essential (primary) hypertension: Secondary | ICD-10-CM

## 2020-08-16 DIAGNOSIS — F603 Borderline personality disorder: Secondary | ICD-10-CM

## 2020-08-16 MED ORDER — NITROGLYCERIN 0.4 MG SL SUBL
0.4000 mg | SUBLINGUAL_TABLET | SUBLINGUAL | 3 refills | Status: DC | PRN
Start: 2020-08-16 — End: 2021-09-25

## 2020-08-16 NOTE — Progress Notes (Signed)
BP 108/64   Pulse 77   Temp 98.2 F (36.8 C)   SpO2 97%    Subjective:    Patient ID: Brad Randolph, male    DOB: March 04, 1976, 45 y.o.   MRN: [id number]  HPI: Brad Randolph is a 45 y.o. male presenting on 08/16/2020 for No chief complaint on file.   HPI  Pt had a negative covid 19 screening questionnaire.      Pt is a 36yoM with hyperlipidemia, HTN, borderline personality, bipolar, anxiety, MI,  CAD and MH issues who presents for routine follow up.  He is still going to [place] for [place] issues  He says he is doing okay  He had CP and went to the hospital on 06/19/20 and he says they couldn't find anything.  He didn't have any NTG or he says he would have taken it.  He says he hasn't had any NTG for at least a year.    He got first 2 covid shots but not yet his booster.    He is on recall to return to cardiology in march  He didn't get his labs drawn.  He says he has trouble getting a ride.  He says he hasn't had prescription for ntg in a long time  Pt reports no new problems.       Relevant past medical, surgical, family and social history reviewed and updated as indicated. Interim medical history since our last visit reviewed. Allergies and medications reviewed and updated.   Current Outpatient Medications:  .  amLODipine (NORVASC) 2.5 MG tablet, Take 1 tablet (2.5 mg total) by mouth daily., Disp: 90 tablet, Rfl: 1 .  ASPIRIN ADULT LOW STRENGTH 81 MG EC tablet, TAKE 1 Tablet BY MOUTH ONCE DAILY, Disp: 90 tablet, Rfl: 0 .  atorvastatin (LIPITOR) 40 MG tablet, Take 40 mg by mouth daily., Disp: , Rfl:  .  isosorbide mononitrate (IMDUR) 30 MG 24 hr tablet, TAKE 1 Tablet BY MOUTH ONCE DAILY, Disp: 90 tablet, Rfl: 0 .  metoprolol tartrate (LOPRESSOR) 25 MG tablet, Take 1 tablet (25 mg total) by mouth 2 (two) times daily., Disp: 180 tablet, Rfl: 0 .  Paliperidone ER (INVEGA SUSTENNA) injection, Inject into the muscle every 30 (thirty) days., Disp: , Rfl:  .  PROVENTIL HFA 108  (90 Base) MCG/ACT inhaler, INHALE 2 PUFFS BY MOUTH EVERY 6 HOURS AS NEEDED FOR COUGHING, WHEEZING, OR SHORTNESS OF BREATH, Disp: 20.1 g, Rfl: 1 .  sertraline (ZOLOFT) 25 MG tablet, Take 25 mg by mouth daily., Disp: , Rfl:  .  atorvastatin (LIPITOR) 80 MG tablet, TAKE 1/2 Tablet BY MOUTH ONCE EVERY DAY (Patient not taking: Reported on 08/16/2020), Disp: 45 tablet, Rfl: 1     Review of Systems  Per HPI unless specifically indicated above     Objective:    BP 108/64   Pulse 77   Temp 98.2 F (36.8 C)   SpO2 97%   Wt Readings from Last 3 Encounters:  06/29/20 183 lb (83 kg)  05/11/20 179 lb 11.2 oz (81.5 kg)  02/14/20 186 lb 12 oz (84.7 kg)    Physical Exam Vitals reviewed.  Constitutional:      General: He is not in acute distress.    Appearance: He is not toxic-appearing.     Comments: Pt is disheveled and wearing pajamas.  He has very strong body odor.   HENT:     Head: Normocephalic and atraumatic.  Cardiovascular:     Rate and Rhythm: Normal  rate and regular rhythm.  Pulmonary:     Effort: No respiratory distress.     Breath sounds: Normal breath sounds. No wheezing or rhonchi.  Abdominal:     General: Bowel sounds are normal.     Palpations: There is no mass.     Tenderness: There is no abdominal tenderness.  Musculoskeletal:     Right lower leg: No edema.     Left lower leg: No edema.  Neurological:     Mental Status: He is alert.     Gait: Gait is intact. Gait normal.  Psychiatric:        Speech: Speech normal.        Behavior: Behavior is cooperative.     Comments: Pt is constantly bouncing his legs during appointment.  He is polite and answers questions when questioned.   His grooming is down from previous encounters.            Assessment & Plan:    Encounter Diagnoses  Name Primary?  . Essential hypertension Yes  . Hyperlipidemia, unspecified hyperlipidemia type   . Coronary artery disease involving native heart without angina pectoris,  unspecified vessel or lesion type   . Tobacco use disorder   . Borderline personality disorder ([place])   . Bipolar affective disorder, current episode depressed, current episode severity unspecified ([place])      -Offered care connect to help with a ride but he declined -prescription for NTG was sent to [facility] -discussed with pt that he is due for follow up with cardiology in March and he should make sure that he sees them -Pt to get labs drawn.  He will be called with results -encouraged pt to get covid booster -pt to continue with [facility] for MH issues -encouraged smoking cessation -pt to follow up  3 months.  He is to contact office sooner prn

## 2020-11-10 ENCOUNTER — Other Ambulatory Visit: Payer: Self-pay | Admitting: Cardiology

## 2020-11-10 ENCOUNTER — Other Ambulatory Visit: Payer: Self-pay | Admitting: Physician Assistant

## 2020-11-15 ENCOUNTER — Ambulatory Visit: Payer: Self-pay | Admitting: Physician Assistant

## 2020-11-29 ENCOUNTER — Ambulatory Visit: Payer: Self-pay | Admitting: Physician Assistant

## 2020-12-14 ENCOUNTER — Encounter: Payer: Self-pay | Admitting: Physician Assistant

## 2021-09-11 ENCOUNTER — Ambulatory Visit: Payer: Self-pay | Admitting: Critical Care Medicine

## 2021-09-25 ENCOUNTER — Encounter: Payer: Self-pay | Admitting: Critical Care Medicine

## 2021-09-25 ENCOUNTER — Other Ambulatory Visit: Payer: Self-pay

## 2021-09-25 ENCOUNTER — Ambulatory Visit: Payer: MEDICAID | Attending: Critical Care Medicine | Admitting: Critical Care Medicine

## 2021-09-25 VITALS — BP 138/91 | HR 92 | Ht 67.0 in | Wt 188.4 lb

## 2021-09-25 DIAGNOSIS — Z7409 Other reduced mobility: Secondary | ICD-10-CM | POA: Insufficient documentation

## 2021-09-25 DIAGNOSIS — I25118 Atherosclerotic heart disease of native coronary artery with other forms of angina pectoris: Secondary | ICD-10-CM

## 2021-09-25 DIAGNOSIS — F17210 Nicotine dependence, cigarettes, uncomplicated: Secondary | ICD-10-CM

## 2021-09-25 DIAGNOSIS — Z1211 Encounter for screening for malignant neoplasm of colon: Secondary | ICD-10-CM

## 2021-09-25 DIAGNOSIS — I10 Essential (primary) hypertension: Secondary | ICD-10-CM

## 2021-09-25 DIAGNOSIS — G43109 Migraine with aura, not intractable, without status migrainosus: Secondary | ICD-10-CM

## 2021-09-25 DIAGNOSIS — I671 Cerebral aneurysm, nonruptured: Secondary | ICD-10-CM

## 2021-09-25 DIAGNOSIS — Z1159 Encounter for screening for other viral diseases: Secondary | ICD-10-CM

## 2021-09-25 DIAGNOSIS — F3130 Bipolar disorder, current episode depressed, mild or moderate severity, unspecified: Secondary | ICD-10-CM

## 2021-09-25 DIAGNOSIS — Z114 Encounter for screening for human immunodeficiency virus [HIV]: Secondary | ICD-10-CM

## 2021-09-25 DIAGNOSIS — F17200 Nicotine dependence, unspecified, uncomplicated: Secondary | ICD-10-CM

## 2021-09-25 DIAGNOSIS — M5417 Radiculopathy, lumbosacral region: Secondary | ICD-10-CM

## 2021-09-25 DIAGNOSIS — R569 Unspecified convulsions: Secondary | ICD-10-CM

## 2021-09-25 DIAGNOSIS — Z139 Encounter for screening, unspecified: Secondary | ICD-10-CM

## 2021-09-25 DIAGNOSIS — K029 Dental caries, unspecified: Secondary | ICD-10-CM

## 2021-09-25 DIAGNOSIS — F5101 Primary insomnia: Secondary | ICD-10-CM

## 2021-09-25 DIAGNOSIS — R6889 Other general symptoms and signs: Secondary | ICD-10-CM | POA: Insufficient documentation

## 2021-09-25 DIAGNOSIS — H539 Unspecified visual disturbance: Secondary | ICD-10-CM

## 2021-09-25 MED ORDER — AMLODIPINE BESYLATE 5 MG PO TABS: 5.0000 mg | ORAL_TABLET | Freq: Every day | ORAL | 2 refills | Status: DC

## 2021-09-25 MED ORDER — METOPROLOL TARTRATE 25 MG PO TABS: 25.0000 mg | ORAL_TABLET | Freq: Two times a day (BID) | ORAL | 2 refills | Status: DC

## 2021-09-25 MED ORDER — ATORVASTATIN CALCIUM 40 MG PO TABS: 40.0000 mg | ORAL_TABLET | Freq: Every day | ORAL | 2 refills | Status: DC

## 2021-09-25 MED ORDER — ISOSORBIDE MONONITRATE ER 30 MG PO TB24: 30.0000 mg | ORAL_TABLET | Freq: Every day | ORAL | 2 refills | Status: DC

## 2021-09-25 MED ORDER — ALBUTEROL SULFATE HFA 108 (90 BASE) MCG/ACT IN AERS: INHALATION_SPRAY | RESPIRATORY_TRACT | 1 refills | Status: DC

## 2021-09-25 NOTE — Assessment & Plan Note (Signed)
? ? ??   Current smoking consumption amount: 1/2 pack a day ? ?? Dicsussion on advise to quit smoking and smoking impacts: Cardiovascular lung impacts ? ?? Patient's willingness to quit: Wants to quit ? ?? Methods to quit smoking discussed: They have a modification ? ?? Medication management of smoking session drugs discussed: No medications are indicated due to adverse side effects ? ?? Resources provided:  AVS  ? ?? Setting quit date not established ? ?? Follow-up arranged 2 months ? ? ?Time spent counseling the patient: 5 minutes ? ? ?

## 2021-09-25 NOTE — Progress Notes (Signed)
PAD: Abnormally weak pulse detected ?Possibility of severe PAD or incompressible arteries  ?

## 2021-09-25 NOTE — Assessment & Plan Note (Signed)
Concern for peripheral artery disease will refer to vascular surgery ?

## 2021-09-25 NOTE — Assessment & Plan Note (Signed)
We will reimage lumbar spine ?

## 2021-09-25 NOTE — Assessment & Plan Note (Signed)
History of restless legs and now insomnia we will plan home sleep study ?

## 2021-09-25 NOTE — Assessment & Plan Note (Signed)
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 ?Patient needs better blood pressure control and needs follow-up with neurology ? ?Referral back to neurology was made ?

## 2021-09-25 NOTE — Assessment & Plan Note (Signed)
Patient with exertional chest pain previous stenting of the right coronary artery will refer back to cardiology ?

## 2021-09-25 NOTE — Assessment & Plan Note (Signed)
Blood pressure not well controlled we will make adjustments by increasing amlodipine to 5 mg daily, continue isosorbide daily continue metoprolol daily ? ?Patient given a lifestyle medicine handout for healthy diet and also will need dental care as he needs multiple carious teeth removed ?

## 2021-09-25 NOTE — Assessment & Plan Note (Signed)
Visual disturbance with hypertension referred to ophthalmology for retinal exam ?

## 2021-09-25 NOTE — Progress Notes (Signed)
? ?New Patient Office Visit ? ?Subjective:  ?Patient ID: Brad Randolph, male    DOB: 12/04/1975  Age: 46 y.o. MRN: [id number] ? ?CC:  ?Chief Complaint  ?Patient presents with  ? Medication Refill  ? New Patient (Initial Visit)  ? ? ?HPI ?Brad Randolph presents for new patient evaluation.  This is a 46 year old male prior history of bipolar disorder, coronary disease with drug-eluting stent x2 the right coronary artery in 2017 and low risk stress test noninvasive October 2019.  Asthma since childhood, depression, previous myocardial infarction, severe dental conditions and caries, chronic leg pain with exertion, chest pain with exertion, hyperlipidemia and hypertension, absence seizure, borderline personality disorder. ? ?The patient was last seen about a year ago in another primary care clinic and has not had follow-up in 1 year.  He has had no recent emergency room visits.  He does need colon cancer screening at some point.  Patient does have insurance now with [employer].  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  He still smokes actively half a pack a day.  He has daytime hypersomnolence and snoring at night and disrupted sleep hard to fall asleep and cannot stay asleep.  He has chronic low back pain.  He is followed with [facility] in [facility] receives [place] injections monthly for this.  He states he has had no further absence seizure's since the [place] was started.  He does have chronic knee foot and back pain with exertion.  He does also have chronic headaches as well.  He also has chest pain when he exerts himself.  He has change in vision as well.  He does have stents in the right coronary artery as noted.  He has been taking 81 mg of aspirin.  He needs refills on all of his other medications. ?Patient does not tend to eat a healthy diet ?Past Medical History:  ?Diagnosis Date  ? Anxiety   ? Asthma   ? chilldhood  ? Bipolar disorder ([place])   ? Borderline personality disorder ([place])    ? CAD (coronary artery disease)   ? a. s/p DESx2 to RCA in 12/2015 b. low-risk NST in 03/2018  ? Depression   ? Heart attack ([place])   ? Hyperlipidemia   ? Hypertension   ? Migraine   ? Seizures ([place]) 2018  ? last sz 12/2017  ? ? ?Past Surgical History:  ?Procedure Laterality Date  ? CORONARY ANGIOPLASTY WITH STENT PLACEMENT    ? ? ?Family History  ?Problem Relation Age of Onset  ? Diabetes Mother   ? Hypertension Mother   ? Diabetes Sister   ? Cancer Sister   ? Diabetes Maternal Aunt   ? Cancer Paternal Aunt   ? Diabetes Maternal Grandmother   ? Hypertension Maternal Grandmother   ? Hypertension Maternal Grandfather   ? Cancer Maternal Grandfather   ?     lung cancer  ? Hypertension Paternal Grandmother   ? Heart disease Father   ? Diabetes Father   ? ? ?Social History  ? ?Socioeconomic History  ? Marital status: Married  ?  Spouse name: Not on file  ? Number of children: 2  ? Years of education: 46  ? Highest education level: Not on file  ?Occupational History  ?  Comment: NA  ?Tobacco Use  ? Smoking status: Every Day  ?  Packs/day: 0.25  ?  Years: 33.00  ?  Pack years: 8.25  ?  Types: Cigarettes  ? Smokeless tobacco: Never  ?Vaping Use  ? Vaping Use: Never used  ?Substance and Sexual Activity  ? Alcohol use: Not Currently  ? Drug use: Not Currently  ?  Types: Marijuana, Methamphetamines  ? Sexual activity: Not Currently  ?Other Topics Concern  ? Not on file  ?Social History Narrative  ? Lives with sister Lori's daughter, his neice  ? Tea, some Coke  ? ?Social Determinants of Health  ? ?Financial Resource Strain: Not on file  ?Food Insecurity: Not on file  ?Transportation Needs: Not on file  ?Physical Activity: Not on file  ?Stress: Not on file  ?Social Connections: Not on file  ?Intimate Partner Violence: Not on file  ? ? ?ROS ?Review of Systems  ?Constitutional: Negative.   ?HENT: Negative.  Negative for ear pain, postnasal drip, rhinorrhea, sinus pressure, sore throat, trouble swallowing and voice change.    ?Eyes:  Positive for visual disturbance.  ?Respiratory: Negative.  Negative for apnea, cough, choking, chest tightness, shortness of breath, wheezing and stridor.   ?Cardiovascular:  Positive for chest pain. Negative for palpitations and leg swelling.  ?     Chest pain: if active :  or stressed.  Job: [employer] if busy and will cause Chest pain  ?Gastrointestinal: Negative.  Negative for abdominal distention, abdominal pain, nausea and vomiting.  ?Genitourinary: Negative.   ?Musculoskeletal:  Positive for back pain. Negative for arthralgias and myalgias.  ?     Lower leg and foot pain  ?Skin: Negative.  Negative for rash.  ?Allergic/Immunologic: Negative.  Negative for environmental allergies and food allergies.  ?Neurological:  Positive for numbness. Negative for dizziness, syncope, weakness and headaches.  ?Hematological: Negative.  Negative for adenopathy. Does not bruise/bleed easily.  ?Psychiatric/Behavioral:  Positive for dysphoric mood and sleep disturbance. Negative for agitation, confusion, decreased concentration, hallucinations, self-injury and suicidal ideas. The patient is nervous/anxious. The patient is not hyperactive.   ? ?Objective:  ? ?Today's Vitals: BP (!) 138/91   Pulse 92   Ht 5' 7"  (1.702 m)   Wt 188 lb 6.4 oz (85.5 kg)   SpO2 98%   BMI 29.51 kg/m?  ? ?Physical Exam ?Vitals reviewed.  ?Constitutional:   ?   Appearance: Normal appearance. He is well-developed. He is obese. He is not diaphoretic.  ?HENT:  ?   Head: Normocephalic and atraumatic.  ?   Nose: Nose normal. No nasal deformity, septal deviation, mucosal edema or rhinorrhea.  ?   Right Sinus: No maxillary sinus tenderness or frontal sinus tenderness.  ?   Left Sinus: No maxillary sinus tenderness or frontal sinus tenderness.  ?   Mouth/Throat:  ?   Mouth: Mucous membranes are moist.  ?   Pharynx: Oropharynx is clear. No oropharyngeal exudate.  ?   Comments: Very poor dentition with severe periodontal disease carious teeth and  missing teeth gum recession ?Eyes:  ?   General: No scleral icterus. ?   Conjunctiva/sclera: Conjunctivae normal.  ?   Pupils: Pupils are equal, round, and reactive to light.  ?Neck:  ?   Thyroid: No thyromegaly.  ?   Vascular: No carotid bruit or JVD.  ?   Trachea: Trachea normal. No tracheal tenderness or tracheal deviation.  ?Cardiovascular:  ?   Rate and Rhythm: Normal rate and regular rhythm.  ?   Chest Wall: PMI is not displaced.  ?   Pulses: Normal pulses. No decreased pulses.  ?   Heart sounds: Normal heart sounds, S1 normal  and S2 normal. Heart sounds not distant. No murmur heard. ?No systolic murmur is present.  ?No diastolic murmur is present.  ?  No friction rub. No gallop. No S3 or S4 sounds.  ?   Comments: Decreased pulses in the right foot area ?Pulmonary:  ?   Effort: No tachypnea, accessory muscle usage or respiratory distress.  ?   Breath sounds: No stridor. No decreased breath sounds, wheezing, rhonchi or rales.  ?Chest:  ?   Chest wall: No tenderness.  ?Abdominal:  ?   General: Bowel sounds are normal. There is no distension.  ?   Palpations: Abdomen is soft. Abdomen is not rigid.  ?   Tenderness: There is no abdominal tenderness. There is no guarding or rebound.  ?Musculoskeletal:     ?   General: Normal range of motion.  ?   Cervical back: Normal range of motion and neck supple. No edema, erythema or rigidity. No muscular tenderness. Normal range of motion.  ?Lymphadenopathy:  ?   Head:  ?   Right side of head: No submental or submandibular adenopathy.  ?   Left side of head: No submental or submandibular adenopathy.  ?   Cervical: No cervical adenopathy.  ?Skin: ?   General: Skin is warm and dry.  ?   Coloration: Skin is not pale.  ?   Findings: No rash.  ?   Nails: There is no clubbing.  ?Neurological:  ?   Mental Status: He is alert and oriented to person, place, and time.  ?   Sensory: No sensory deficit.  ?Psychiatric:     ?   Speech: Speech normal.     ?   Behavior: Behavior normal.   ? ?ABI is abnormal suggesting critical bilateral vascular arterial decreased perfusion ?Assessment & Plan:  ? ?Problem List Items Addressed This Visit   ? ?  ? Cardiovascular and Mediastinum  ? Essential hypertension

## 2021-09-25 NOTE — Assessment & Plan Note (Signed)
Recommended patient continue to follow with psychiatry ?

## 2021-09-25 NOTE — Assessment & Plan Note (Signed)
Severe periodontal disease and dental caries significantly affecting patient's cardiovascular status needs dental evaluation as soon as possible patient will follow-up on this ?

## 2021-09-25 NOTE — Patient Instructions (Signed)
Cologuard kit was mailed to the home for colon cancer screening ? ?Lumbar x-rays will be obtained ? ?Refills on all medications were [facility] your [facility] note I increased the amlodipine to 5 mg a day ? ?Please get a dental exam he will need to have most of your teeth pulled ? ?Ophthalmology eye doctor referral was made and cardiology referral and neurology referrals were made ? ?Home sleep study was ordered ? ?Focus on tobacco cessation see attachment ? ?Keep your appointments with your psychiatry team ? ?Return to see Dr. Joya Gaskins 2 months for blood pressure recheck ? ?Obtain a home blood pressure meter measure these results bring back at next visit ? ?Full set of lab screenings obtained at this visit we will call you results ? ?Lifestyle medicine handout given please review the diet with your teeth may be difficult to eat some of the fresh vegetables as noted in the nuts as noted ? ? ?

## 2021-09-26 LAB — CBC WITH DIFFERENTIAL/PLATELET
Basophils Absolute: 0 10*3/uL (ref 0.0–0.2)
Basos: 1 %
EOS (ABSOLUTE): 0.2 10*3/uL (ref 0.0–0.4)
Eos: 3 %
Hematocrit: 42.2 % (ref 37.5–51.0)
Hemoglobin: 14.1 g/dL (ref 13.0–17.7)
Immature Grans (Abs): 0 10*3/uL (ref 0.0–0.1)
Immature Granulocytes: 0 %
Lymphocytes Absolute: 2.5 10*3/uL (ref 0.7–3.1)
Lymphs: 32 %
MCH: 30.4 pg (ref 26.6–33.0)
MCHC: 33.4 g/dL (ref 31.5–35.7)
MCV: 91 fL (ref 79–97)
Monocytes Absolute: 0.6 10*3/uL (ref 0.1–0.9)
Monocytes: 8 %
Neutrophils Absolute: 4.4 10*3/uL (ref 1.4–7.0)
Neutrophils: 56 %
Platelets: 182 10*3/uL (ref 150–450)
RBC: 4.64 x10E6/uL (ref 4.14–5.80)
RDW: 12.2 % (ref 11.6–15.4)
WBC: 7.7 10*3/uL (ref 3.4–10.8)

## 2021-09-26 LAB — HIV ANTIBODY (ROUTINE TESTING W REFLEX): HIV Screen 4th Generation wRfx: NONREACTIVE

## 2021-09-26 LAB — COMPREHENSIVE METABOLIC PANEL
ALT: 18 IU/L (ref 0–44)
AST: 21 IU/L (ref 0–40)
Albumin/Globulin Ratio: 1.6 (ref 1.2–2.2)
Albumin: 4.2 g/dL (ref 4.0–5.0)
Alkaline Phosphatase: 61 IU/L (ref 44–121)
BUN/Creatinine Ratio: 11 (ref 9–20)
BUN: 10 mg/dL (ref 6–24)
Bilirubin Total: 0.8 mg/dL (ref 0.0–1.2)
CO2: 22 mmol/L (ref 20–29)
Calcium: 9.1 mg/dL (ref 8.7–10.2)
Chloride: 104 mmol/L (ref 96–106)
Creatinine, Ser: 0.95 mg/dL (ref 0.76–1.27)
Globulin, Total: 2.6 g/dL (ref 1.5–4.5)
Glucose: 100 mg/dL — ABNORMAL HIGH (ref 70–99)
Potassium: 4.3 mmol/L (ref 3.5–5.2)
Sodium: 138 mmol/L (ref 134–144)
Total Protein: 6.8 g/dL (ref 6.0–8.5)
eGFR: 100 mL/min/{1.73_m2} (ref >59)

## 2021-09-26 LAB — LIPID PANEL
Chol/HDL Ratio: 3.1 ratio (ref 0.0–5.0)
Cholesterol, Total: 105 mg/dL (ref 100–199)
HDL: 34 mg/dL — ABNORMAL LOW (ref >39)
LDL Chol Calc (NIH): 58 mg/dL (ref 0–99)
Triglycerides: 59 mg/dL (ref 0–149)
VLDL Cholesterol Cal: 13 mg/dL (ref 5–40)

## 2021-09-26 LAB — HEMOGLOBIN A1C
Est. average glucose Bld gHb Est-mCnc: 114 mg/dL
Hgb A1c MFr Bld: 5.6 % (ref 4.8–5.6)

## 2021-09-26 LAB — HCV INTERPRETATION

## 2021-09-26 LAB — HCV AB W REFLEX TO QUANT PCR: HCV Ab: NONREACTIVE

## 2021-09-27 ENCOUNTER — Telehealth: Payer: Self-pay

## 2021-09-27 NOTE — Telephone Encounter (Signed)
-----   Message from Elsie Stain, MD sent at 09/26/2021  6:38 AM EDT ----- ?Let pt know liver kidney normal, cholesterol is at goal, hiv neg, hep c neg, blood count normal ,no diabetes, no medication changes ?

## 2021-09-27 NOTE — Telephone Encounter (Signed)
Pt was called and vm was left, Information has been sent to nurse pool.   

## 2021-10-25 ENCOUNTER — Telehealth: Payer: Self-pay

## 2021-10-25 LAB — COLOGUARD: COLOGUARD: NEGATIVE

## 2021-10-25 NOTE — Telephone Encounter (Signed)
Pt was called and is aware of results, DOB was confirmed.  ?

## 2021-10-25 NOTE — Telephone Encounter (Signed)
-----   Message from Elsie Stain, MD sent at 10/25/2021  6:11 AM EDT ----- ?Let pt know cologuard Negative  no colon cancer recheck in three years ?

## 2021-10-29 ENCOUNTER — Telehealth: Payer: Self-pay | Admitting: Critical Care Medicine

## 2021-10-29 NOTE — Telephone Encounter (Signed)
Copied from [place] [phone]. Topic: General - Other ?>> Oct 29, 2021  9:30 AM Tessa Lerner A wrote: ?Reason for CRM: The patient has called to request orders for tb testing  ? ?Please contact further when possible ?

## 2021-10-29 NOTE — Telephone Encounter (Signed)
Called and left vm  °

## 2021-10-30 ENCOUNTER — Other Ambulatory Visit: Payer: Self-pay | Admitting: *Deleted

## 2021-10-30 ENCOUNTER — Other Ambulatory Visit: Payer: Self-pay

## 2021-10-30 DIAGNOSIS — R6889 Other general symptoms and signs: Secondary | ICD-10-CM

## 2021-11-13 ENCOUNTER — Ambulatory Visit (HOSPITAL_COMMUNITY)
Admission: RE | Admit: 2021-11-13 | Discharge: 2021-11-13 | Disposition: A | Discharge status: Home / Self Care | Payer: Commercial Managed Care - PPO | Source: Ambulatory Visit | Attending: Vascular Surgery | Admitting: Vascular Surgery

## 2021-11-13 ENCOUNTER — Encounter: Payer: Self-pay | Admitting: Vascular Surgery

## 2021-11-13 ENCOUNTER — Ambulatory Visit: Payer: Commercial Managed Care - PPO | Admitting: Vascular Surgery

## 2021-11-13 DIAGNOSIS — I10 Essential (primary) hypertension: Secondary | ICD-10-CM | POA: Diagnosis not present

## 2021-11-13 DIAGNOSIS — I252 Old myocardial infarction: Secondary | ICD-10-CM | POA: Diagnosis not present

## 2021-11-13 DIAGNOSIS — E785 Hyperlipidemia, unspecified: Secondary | ICD-10-CM | POA: Diagnosis not present

## 2021-11-13 DIAGNOSIS — F17200 Nicotine dependence, unspecified, uncomplicated: Secondary | ICD-10-CM | POA: Insufficient documentation

## 2021-11-13 DIAGNOSIS — M79661 Pain in right lower leg: Secondary | ICD-10-CM

## 2021-11-13 DIAGNOSIS — M79662 Pain in left lower leg: Secondary | ICD-10-CM

## 2021-11-13 DIAGNOSIS — I2510 Atherosclerotic heart disease of native coronary artery without angina pectoris: Secondary | ICD-10-CM | POA: Insufficient documentation

## 2021-11-13 DIAGNOSIS — R9439 Abnormal result of other cardiovascular function study: Secondary | ICD-10-CM | POA: Diagnosis not present

## 2021-11-13 DIAGNOSIS — M79604 Pain in right leg: Secondary | ICD-10-CM

## 2021-11-13 DIAGNOSIS — R6889 Other general symptoms and signs: Secondary | ICD-10-CM

## 2021-11-13 DIAGNOSIS — M79605 Pain in left leg: Secondary | ICD-10-CM | POA: Diagnosis not present

## 2021-11-13 DIAGNOSIS — M79606 Pain in leg, unspecified: Secondary | ICD-10-CM | POA: Insufficient documentation

## 2021-11-13 NOTE — Progress Notes (Signed)
? ? ?Patient name: Brad Randolph MRN: [id number] DOB: Nov 12, 1975 Sex: male ? ?REASON FOR CONSULT: Evaluate abnormal ABIs and leg pain ? ?HPI: ?Brad Randolph is a 46 y.o. male, with history of hypertension, hyperlipidemia, coronary artery disease status post previous PCI that presents for evaluation of abnormal ABIs and possible peripheral arterial disease.  Patient states he has been having pain in both legs for the last 3 months.  He describes it as a sharp, stinging, numbness below both knees.  This is usually worse when he sitting down and bending his legs.  It can also happen sitting or standing and even walking.  Happens intermittently on a daily basis.  He has no history of previous peripheral interventions.  He does smoke about 1/4 pack cigarettes a day.  States he had some abnormal vascular studies from his PCPs office Dr. Joya Gaskins. ? ?Past Medical History:  ?Diagnosis Date  ? Anxiety   ? Asthma   ? chilldhood  ? Bipolar disorder ([place])   ? Borderline personality disorder ([place])   ? CAD (coronary artery disease)   ? a. s/p DESx2 to RCA in 12/2015 b. low-risk NST in 03/2018  ? Depression   ? Heart attack ([place])   ? Hyperlipidemia   ? Hypertension   ? Migraine   ? Seizures ([place]) 2018  ? last sz 12/2017  ? ? ?Past Surgical History:  ?Procedure Laterality Date  ? CORONARY ANGIOPLASTY WITH STENT PLACEMENT    ? ? ?Family History  ?Problem Relation Age of Onset  ? Diabetes Mother   ? Hypertension Mother   ? Diabetes Sister   ? Cancer Sister   ? Diabetes Maternal Aunt   ? Cancer Paternal Aunt   ? Diabetes Maternal Grandmother   ? Hypertension Maternal Grandmother   ? Hypertension Maternal Grandfather   ? Cancer Maternal Grandfather   ?     lung cancer  ? Hypertension Paternal Grandmother   ? Heart disease Father   ? Diabetes Father   ? ? ?SOCIAL HISTORY: ?Social History  ? ?Socioeconomic History  ? Marital status: Married  ?  Spouse name: Not on file  ? Number of children: 2  ? Years of education: 88  ? Highest education  level: Not on file  ?Occupational History  ?  Comment: NA  ?Tobacco Use  ? Smoking status: Every Day  ?  Packs/day: 0.25  ?  Years: 33.00  ?  Pack years: 8.25  ?  Types: Cigarettes  ? Smokeless tobacco: Never  ?Vaping Use  ? Vaping Use: Never used  ?Substance and Sexual Activity  ? Alcohol use: Not Currently  ? Drug use: Not Currently  ?  Types: Marijuana, Methamphetamines  ? Sexual activity: Not Currently  ?Other Topics Concern  ? Not on file  ?Social History Narrative  ? Lives with sister Brad Randolph's daughter, his neice  ? Tea, some Coke  ? ?Social Determinants of Health  ? ?Financial Resource Strain: Not on file  ?Food Insecurity: Not on file  ?Transportation Needs: Not on file  ?Physical Activity: Not on file  ?Stress: Not on file  ?Social Connections: Not on file  ?Intimate Partner Violence: Not on file  ? ? ?Allergies  ?Allergen Reactions  ? Soap Rash  ?  Dial soap peels skin  ?Dial soap peels skin  ?Dial soap peels skin  ?  ? Tramadol Other (See Comments)  ?  "mean" and aggressive per pt ?"mean" and aggressive per pt ?"mean" and aggressive per pt ?aggression  ?  ?  Mushroom Extract Complex Diarrhea and Nausea And Vomiting  ? Other Hives  ?  Dial Soap. - breaks out and blisters  ? Trazodone And Nefazodone Other (See Comments)  ?  Violent  ? ? ?Current Outpatient Medications  ?Medication Sig Dispense Refill  ? albuterol (PROVENTIL HFA) 108 (90 Base) MCG/ACT inhaler INHALE 2 PUFFS BY MOUTH EVERY 6 HOURS AS NEEDED FOR COUGHING, WHEEZING, OR SHORTNESS OF BREATH 18 g 1  ? amLODipine (NORVASC) 5 MG tablet Take 1 tablet (5 mg total) by mouth daily. 90 tablet 2  ? ASPIRIN LOW DOSE 81 MG EC tablet TAKE 1 Tablet BY MOUTH ONCE DAILY 30 tablet 0  ? atorvastatin (LIPITOR) 40 MG tablet Take 1 tablet (40 mg total) by mouth daily. 90 tablet 2  ? isosorbide mononitrate (IMDUR) 30 MG 24 hr tablet Take 1 tablet (30 mg total) by mouth daily. 90 tablet 2  ? metoprolol tartrate (LOPRESSOR) 25 MG tablet Take 1 tablet (25 mg total) by  mouth 2 (two) times daily. 180 tablet 2  ? Paliperidone ER (INVEGA SUSTENNA) injection Inject into the muscle every 30 (thirty) days.    ? ?No current facility-administered medications for this visit.  ? ? ?REVIEW OF SYSTEMS:  ?[X]  denotes positive finding, [ ]  denotes negative finding ?Cardiac  Comments:  ?Chest pain or chest pressure:    ?Shortness of breath upon exertion:    ?Short of breath when lying flat:    ?Irregular heart rhythm:    ?    ?Vascular    ?Pain in calf, thigh, or hip brought on by ambulation:    ?Pain in feet at night that wakes you up from your sleep:     ?Blood clot in your veins:    ?Leg swelling:     ?    ?Pulmonary    ?Oxygen at home:    ?Productive cough:     ?Wheezing:     ?    ?Neurologic    ?Sudden weakness in arms or legs:     ?Sudden numbness in arms or legs:  x Both legs  ?Sudden onset of difficulty speaking or slurred speech:    ?Temporary loss of vision in one eye:     ?Problems with dizziness:     ?    ?Gastrointestinal    ?Blood in stool:     ?Vomited blood:     ?    ?Genitourinary    ?Burning when urinating:     ?Blood in urine:    ?    ?Psychiatric    ?Major depression:     ?    ?Hematologic    ?Bleeding problems:    ?Problems with blood clotting too easily:    ?    ?Skin    ?Rashes or ulcers:    ?    ?Constitutional    ?Fever or chills:    ? ? ?PHYSICAL EXAM: ?Vitals:  ? 11/13/21 1144  ?BP: 107/71  ?Pulse: 62  ?Resp: 16  ?Temp: (!) 97.4 ?F (36.3 ?C)  ?TempSrc: Temporal  ?SpO2: 96%  ?Weight: 185 lb (83.9 kg)  ?Height: 5' 8"  (1.727 m)  ? ? ?GENERAL: The patient is a well-nourished male, in no acute distress. The vital signs are documented above. ?CARDIAC: There is a regular rate and rhythm.  ?VASCULAR:  ?Palpable radial pulses bilaterally ?Palpable femoral pulses bilaterally ?Palpable PT DP pulses bilaterally ?No lower extremity tissue loss ?PULMONARY: No respiratory distress. ?ABDOMEN: Soft and non-tender. ?MUSCULOSKELETAL: There are no major  deformities or  cyanosis. ?NEUROLOGIC: No focal weakness or paresthesias are detected. ?SKIN: There are no ulcers or rashes noted. ?PSYCHIATRIC: The patient has a normal affect. ? ?DATA:  ? ?ABIs today are 1.16 on the right triphasic and 1.15 on the left triphasic with no evidence of lower extremity arterial disease ? ?Assessment/Plan: ? ?46 year old male presents for evaluation of abnormal ABIs and possible peripheral arterial disease in the setting of bilateral lower extremity leg pain.  I discussed that although he has risk factors for peripheral arterial disease, his ABIs today are normal with no evidence of arterial disease and he has a normal exam with easily palpable dorsalis pedis and posterior tibial pulses.  His symptoms are not classic for vascular etiology.  Discussed there is no role for vascular intervention with normal studies and a normal exam.  I think other etiologies should be explored and this could be related to his back with nerve compression or other etiology.  He can follow-up with me PRN. ? ? ?Marty Heck, MD ?[facility] ?Office: [phone] ? ? ? ? ?

## 2021-12-04 ENCOUNTER — Encounter: Payer: Self-pay | Admitting: Critical Care Medicine

## 2021-12-04 ENCOUNTER — Ambulatory Visit
Admission: RE | Admit: 2021-12-04 | Discharge: 2021-12-04 | Disposition: A | Discharge status: Home / Self Care | Payer: Commercial Managed Care - PPO | Source: Ambulatory Visit | Attending: Critical Care Medicine | Admitting: Critical Care Medicine

## 2021-12-04 ENCOUNTER — Ambulatory Visit: Payer: MEDICAID | Attending: Critical Care Medicine | Admitting: Critical Care Medicine

## 2021-12-04 VITALS — BP 113/75 | HR 68 | Wt 188.2 lb

## 2021-12-04 DIAGNOSIS — M25562 Pain in left knee: Secondary | ICD-10-CM

## 2021-12-04 DIAGNOSIS — H539 Unspecified visual disturbance: Secondary | ICD-10-CM

## 2021-12-04 DIAGNOSIS — I25118 Atherosclerotic heart disease of native coronary artery with other forms of angina pectoris: Secondary | ICD-10-CM

## 2021-12-04 DIAGNOSIS — M5417 Radiculopathy, lumbosacral region: Secondary | ICD-10-CM

## 2021-12-04 DIAGNOSIS — I10 Essential (primary) hypertension: Secondary | ICD-10-CM

## 2021-12-04 DIAGNOSIS — F17200 Nicotine dependence, unspecified, uncomplicated: Secondary | ICD-10-CM

## 2021-12-04 DIAGNOSIS — G8929 Other chronic pain: Secondary | ICD-10-CM

## 2021-12-04 DIAGNOSIS — G43109 Migraine with aura, not intractable, without status migrainosus: Secondary | ICD-10-CM | POA: Diagnosis not present

## 2021-12-04 DIAGNOSIS — M25561 Pain in right knee: Secondary | ICD-10-CM

## 2021-12-04 MED ORDER — DICLOFENAC SODIUM 1 % EX GEL: 4.0000 g | Freq: Four times a day (QID) | CUTANEOUS | 1 refills | Status: DC

## 2021-12-04 MED ORDER — NICOTINE POLACRILEX 4 MG MT LOZG: LOZENGE | OROMUCOSAL | 4 refills | Status: DC

## 2021-12-04 NOTE — Assessment & Plan Note (Signed)
Encouraged to call the eye doctor to get the appointment established

## 2021-12-04 NOTE — Assessment & Plan Note (Signed)
We will image the lower back

## 2021-12-04 NOTE — Assessment & Plan Note (Signed)
  .   Current smoking consumption amount: 1/2 pack a day  . Dicsussion on advise to quit smoking and smoking impacts: Cardiovascular lung impacts  . Patient's willingness to quit: Wants to quit  . Methods to quit smoking discussed: Behavioral modification and low-dose nicotine  Medication management of smoking session drugs discussed: Low-dose oral nicotine . Resources provided:  AVS   . Setting quit date not established  . Follow-up arranged 2 months   Time spent counseling the patient: 5 minutes

## 2021-12-04 NOTE — Patient Instructions (Signed)
Refills on all medications sent to your pharmacy there are no medication changes except Voltaren gel apply to both knees 4 times daily was sent and also nicotine lozenges use those twice daily to stop smoking and see attachment  Please call the eye Dr. Prudencio Burly [address] [place] , [place] Ph# [phone] for your eye appointment  Perform the exercises as attached  Return to Dr. Joya Gaskins 4 months  Keep upcoming cardiology appointment

## 2021-12-04 NOTE — Assessment & Plan Note (Signed)
Stable at this time no changes made

## 2021-12-04 NOTE — Progress Notes (Signed)
New Patient Office Visit  Subjective:  Patient ID: Brad Randolph, male    DOB: 1975/11/07  Age: 46 y.o. MRN: [id number]  CC:  Chief Complaint  Patient presents with   Leg Pain    Throbbing and aching pain     HPI 08/2021 Brad Randolph presents for new patient evaluation.  This is a 46 year old male prior history of bipolar disorder, coronary disease with drug-eluting stent x2 the right coronary artery in 2017 and low risk stress test noninvasive October 2019.  Asthma since childhood, depression, previous myocardial infarction, severe dental conditions and caries, chronic leg pain with exertion, chest pain with exertion, hyperlipidemia and hypertension, absence seizure, borderline personality disorder.  The patient was last seen about a year ago in another primary care clinic and has not had follow-up in 1 year.  He has had no recent emergency room visits.  He does need colon cancer screening at some point.  Patient does have insurance now with [employer].  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  He still smokes actively half a pack a day.  He has daytime hypersomnolence and snoring at night and disrupted sleep hard to fall asleep and cannot stay asleep.  He has chronic low back pain.  He is followed with [facility] receives [place] injections monthly for this.  He states he has had no further absence seizure's since the [place] was started.  He does have chronic knee foot and back pain with exertion.  He does also have chronic headaches as well.  He also has chest pain when he exerts himself.  He has change in vision as well.  He does have stents in the right coronary artery as noted.  He has been taking 81 mg of aspirin.  He needs refills on all of his other medications. Patient does not tend to eat a healthy diet  12/04/21 this patient is seen in follow-up he saw vascular who had normal vascular studies he does not have PAD.  His complaints are that his  both knees hurt and pain goes down to the ankles.  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  He does not tend to wear socks.  He still smoking 5 cigarettes daily.  Patient is yet to achieve his ophthalmology appointment.  On arrival blood pressure is good 115/75. Patient did process the Cologuard study it was negative in April repeat 3 years Past Medical History:  Diagnosis Date   Anxiety    Asthma    chilldhood   Bipolar disorder ([place])    Borderline personality disorder ([place])    CAD (coronary artery disease)    a. s/p DESx2 to RCA in 12/2015 b. low-risk NST in 03/2018   Depression    Heart attack ([place])    Hyperlipidemia    Hypertension    Migraine    Seizures ([place]) 2018   last sz 12/2017    Past Surgical History:  Procedure Laterality Date   CORONARY ANGIOPLASTY WITH STENT PLACEMENT      Family History  Problem Relation Age of Onset   Diabetes Mother    Hypertension Mother    Diabetes Sister    Cancer Sister    Diabetes Maternal Aunt    Cancer Paternal Aunt    Diabetes Maternal Grandmother    Hypertension Maternal Grandmother    Hypertension Maternal Grandfather    Cancer Maternal Grandfather  lung cancer   Hypertension Paternal Grandmother    Heart disease Father    Diabetes Father     Social History   Socioeconomic History   Marital status: Married    Spouse name: Not on file   Number of children: 2   Years of education: 11   Highest education level: Not on file  Occupational History    Comment: NA  Tobacco Use   Smoking status: Every Day    Packs/day: 0.25    Years: 33.00    Pack years: 8.25    Types: Cigarettes   Smokeless tobacco: Never  Vaping Use   Vaping Use: Never used  Substance and Sexual Activity   Alcohol use: Not Currently   Drug use: Not Currently    Types: Marijuana, Methamphetamines   Sexual activity: Not Currently  Other Topics Concern   Not on file  Social History Narrative   Lives with sister  Lori's daughter, his neice   Tea, some Coke   Social Determinants of Health   Financial Resource Strain: Not on file  Food Insecurity: Not on file  Transportation Needs: Not on file  Physical Activity: Not on file  Stress: Not on file  Social Connections: Not on file  Intimate Partner Violence: Not on file    ROS Review of Systems  Constitutional: Negative.   HENT: Negative.  Negative for ear pain, postnasal drip, rhinorrhea, sinus pressure, sore throat, trouble swallowing and voice change.   Eyes:  Positive for visual disturbance.  Respiratory: Negative.  Negative for apnea, cough, choking, chest tightness, shortness of breath, wheezing and stridor.   Cardiovascular:  Negative for chest pain, palpitations and leg swelling.       Chest pain improved  Gastrointestinal: Negative.  Negative for abdominal distention, abdominal pain, nausea and vomiting.  Genitourinary: Negative.   Musculoskeletal:  Positive for back pain. Negative for arthralgias and myalgias.       Lower leg and foot pain  Skin: Negative.  Negative for rash.  Allergic/Immunologic: Negative.  Negative for environmental allergies and food allergies.  Neurological:  Positive for numbness. Negative for dizziness, syncope, weakness and headaches.  Hematological: Negative.  Negative for adenopathy. Does not bruise/bleed easily.  Psychiatric/Behavioral:  Negative for agitation, confusion, decreased concentration, dysphoric mood, hallucinations, self-injury, sleep disturbance and suicidal ideas. The patient is not nervous/anxious and is not hyperactive.    Objective:   Today's Vitals: BP 113/75   Pulse 68   Wt 188 lb 3.2 oz (85.4 kg)   SpO2 98%   BMI 28.62 kg/m   Physical Exam Vitals reviewed.  Constitutional:      Appearance: Normal appearance. He is well-developed. He is obese. He is not diaphoretic.  HENT:     Head: Normocephalic and atraumatic.     Nose: Nose normal. No nasal deformity, septal deviation,  mucosal edema or rhinorrhea.     Right Sinus: No maxillary sinus tenderness or frontal sinus tenderness.     Left Sinus: No maxillary sinus tenderness or frontal sinus tenderness.     Mouth/Throat:     Mouth: Mucous membranes are moist.     Pharynx: Oropharynx is clear. No oropharyngeal exudate.     Comments: Very poor dentition with severe periodontal disease carious teeth and missing teeth gum recession Eyes:     General: No scleral icterus.    Conjunctiva/sclera: Conjunctivae normal.     Pupils: Pupils are equal, round, and reactive to light.  Neck:     Thyroid: No thyromegaly.  Vascular: No carotid bruit or JVD.     Trachea: Trachea normal. No tracheal tenderness or tracheal deviation.  Cardiovascular:     Rate and Rhythm: Normal rate and regular rhythm.     Chest Wall: PMI is not displaced.     Pulses: Normal pulses. No decreased pulses.     Heart sounds: Normal heart sounds, S1 normal and S2 normal. Heart sounds not distant. No murmur heard. No systolic murmur is present.  No diastolic murmur is present.    No friction rub. No gallop. No S3 or S4 sounds.     Comments: Decreased pulses in the right foot area Pulmonary:     Effort: No tachypnea, accessory muscle usage or respiratory distress.     Breath sounds: No stridor. No decreased breath sounds, wheezing, rhonchi or rales.  Chest:     Chest wall: No tenderness.  Abdominal:     General: Bowel sounds are normal. There is no distension.     Palpations: Abdomen is soft. Abdomen is not rigid.     Tenderness: There is no abdominal tenderness. There is no guarding or rebound.  Musculoskeletal:        General: Tenderness present. Normal range of motion.     Cervical back: Normal range of motion and neck supple. No edema, erythema or rigidity. No muscular tenderness. Normal range of motion.     Right lower leg: No edema.     Left lower leg: No edema.     Comments: Patellar chondromalacia evident bilateral knees Tender lumbar  spine area  Lymphadenopathy:     Head:     Right side of head: No submental or submandibular adenopathy.     Left side of head: No submental or submandibular adenopathy.     Cervical: No cervical adenopathy.  Skin:    General: Skin is warm and dry.     Coloration: Skin is not pale.     Findings: No rash.     Nails: There is no clubbing.  Neurological:     Mental Status: He is alert and oriented to person, place, and time.     Sensory: No sensory deficit.  Psychiatric:        Speech: Speech normal.        Behavior: Behavior normal.   ABI is abnormal suggesting critical bilateral vascular arterial decreased perfusion Assessment & Plan:   Problem List Items Addressed This Visit       Cardiovascular and Mediastinum   Essential hypertension    Stable at this time no changes made       Migraine with aura and without status migrainosus, not intractable    Patient has pending neurology appointment       Relevant Medications   sertraline (ZOLOFT) 25 MG tablet   Coronary artery disease involving native coronary artery of native heart with other form of angina pectoris [facility])    Patient has pending cardiology appointment in July         Nervous and Auditory   Lumbosacral radiculopathy - Primary    We will image the lower back       Relevant Medications   sertraline (ZOLOFT) 25 MG tablet   nicotine polacrilex (NICORETTE MINI) 4 MG lozenge   Other Relevant Orders   DG Lumbar Spine Complete     Other   Tobacco dependence       Current smoking consumption amount: 1/2 pack a day  Dicsussion on advise to quit smoking and smoking impacts: Cardiovascular  lung impacts  Patient's willingness to quit: Wants to quit  Methods to quit smoking discussed: Behavioral modification and low-dose nicotine  Medication management of smoking session drugs discussed: Low-dose oral nicotine Resources provided:  AVS   Setting quit date not established  Follow-up arranged 2  months   Time spent counseling the patient: 5 minutes        Relevant Medications   nicotine polacrilex (NICORETTE MINI) 4 MG lozenge   Visual disturbance    Encouraged to call the eye doctor to get the appointment established       Other Visit Diagnoses     Chronic pain of both knees       Relevant Medications   sertraline (ZOLOFT) 25 MG tablet   Other Relevant Orders   DG Knee Complete 4 Views Left   DG Knee Complete 4 Views Right      Outpatient Encounter Medications as of 12/04/2021  Medication Sig   albuterol (PROVENTIL HFA) 108 (90 Base) MCG/ACT inhaler INHALE 2 PUFFS BY MOUTH EVERY 6 HOURS AS NEEDED FOR COUGHING, WHEEZING, OR SHORTNESS OF BREATH   amLODipine (NORVASC) 5 MG tablet Take 1 tablet (5 mg total) by mouth daily.   ASPIRIN LOW DOSE 81 MG EC tablet TAKE 1 Tablet BY MOUTH ONCE DAILY   atorvastatin (LIPITOR) 40 MG tablet Take 1 tablet (40 mg total) by mouth daily.   diclofenac Sodium (VOLTAREN) 1 % GEL Apply 4 g topically 4 (four) times daily. To both knees   isosorbide mononitrate (IMDUR) 30 MG 24 hr tablet Take 1 tablet (30 mg total) by mouth daily.   metoprolol tartrate (LOPRESSOR) 25 MG tablet Take 1 tablet (25 mg total) by mouth 2 (two) times daily.   nicotine polacrilex (NICORETTE MINI) 4 MG lozenge Use twice a day to stop smoking   Paliperidone ER (INVEGA SUSTENNA) injection Inject into the muscle every 30 (thirty) days.   sertraline (ZOLOFT) 25 MG tablet Take 25 mg by mouth daily.   No facility-administered encounter medications on file as of 12/04/2021.   38 minutes spent evaluating patient providing education on lifestyle medicine performing history and physical reviewing prior records complex decision making is high multiple systems assessed Follow-up: Return in about 4 months (around 04/05/2022).   Asencion Noble, MD

## 2021-12-04 NOTE — Assessment & Plan Note (Signed)
Patient has pending cardiology appointment in July

## 2021-12-04 NOTE — Assessment & Plan Note (Signed)
Patient has pending neurology appointment

## 2021-12-05 ENCOUNTER — Telehealth: Payer: Self-pay | Admitting: Critical Care Medicine

## 2021-12-05 ENCOUNTER — Telehealth: Payer: Self-pay

## 2021-12-05 DIAGNOSIS — G8929 Other chronic pain: Secondary | ICD-10-CM

## 2021-12-05 DIAGNOSIS — M5417 Radiculopathy, lumbosacral region: Secondary | ICD-10-CM

## 2021-12-05 NOTE — Telephone Encounter (Signed)
Copied from [place] [id number]. Topic: General - Other >> Dec 05, 2021 10:42 AM Leroy Kennedy R wrote: Reason for CRM: Pt states he is returning a call from provider regarding 6-6 imaging results  I was unable to locate documentation regarding call to pt, please assist pt further and document.

## 2021-12-05 NOTE — Telephone Encounter (Signed)
Called pt and results from spine scan was given

## 2021-12-05 NOTE — Telephone Encounter (Signed)
-----   Message from Elsie Stain, MD sent at 12/04/2021  4:46 PM EDT ----- Let pt know lumbar spine was normal

## 2021-12-05 NOTE — Telephone Encounter (Signed)
Pt was called and is aware of results, DOB was confirmed.  ?

## 2021-12-06 NOTE — Addendum Note (Signed)
Addended by: Asencion Noble E on: 12/06/2021 12:02 PM   Modules accepted: Orders

## 2021-12-06 NOTE — Telephone Encounter (Signed)
The back hurts because the muscles are spastic because he has to alter the way he walks because he has.  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

## 2021-12-06 NOTE — Telephone Encounter (Signed)
Called pt and he is aware of results

## 2021-12-26 ENCOUNTER — Encounter: Payer: Self-pay | Admitting: Family

## 2021-12-26 ENCOUNTER — Ambulatory Visit (INDEPENDENT_AMBULATORY_CARE_PROVIDER_SITE_OTHER): Payer: Commercial Managed Care - PPO | Admitting: Family

## 2021-12-26 DIAGNOSIS — M25561 Pain in right knee: Secondary | ICD-10-CM

## 2021-12-26 DIAGNOSIS — M5136 Other intervertebral disc degeneration, lumbar region: Secondary | ICD-10-CM

## 2021-12-26 DIAGNOSIS — M1712 Unilateral primary osteoarthritis, left knee: Secondary | ICD-10-CM | POA: Diagnosis not present

## 2021-12-26 DIAGNOSIS — M170 Bilateral primary osteoarthritis of knee: Secondary | ICD-10-CM

## 2021-12-26 DIAGNOSIS — M5440 Lumbago with sciatica, unspecified side: Secondary | ICD-10-CM

## 2021-12-26 DIAGNOSIS — M25562 Pain in left knee: Secondary | ICD-10-CM

## 2021-12-26 DIAGNOSIS — G8929 Other chronic pain: Secondary | ICD-10-CM

## 2021-12-26 DIAGNOSIS — M1711 Unilateral primary osteoarthritis, right knee: Secondary | ICD-10-CM | POA: Diagnosis not present

## 2021-12-26 DIAGNOSIS — M51369 Other intervertebral disc degeneration, lumbar region without mention of lumbar back pain or lower extremity pain: Secondary | ICD-10-CM

## 2021-12-26 MED ORDER — METHYLPREDNISOLONE ACETATE 40 MG/ML IJ SUSP
40.0000 mg | INTRAMUSCULAR | Status: AC | PRN
  Administered 2021-12-26: 40 mg via INTRA_ARTICULAR

## 2021-12-26 MED ORDER — PREDNISONE 50 MG PO TABS: ORAL_TABLET | ORAL | 0 refills | Status: DC

## 2021-12-26 MED ORDER — LIDOCAINE HCL 1 % IJ SOLN
5.0000 mL | INTRAMUSCULAR | Status: AC | PRN
  Administered 2021-12-26: 5 mL

## 2021-12-26 NOTE — Progress Notes (Signed)
Office Visit Note   Patient: Brad Randolph           Date of Birth: 06-Apr-1976           MRN: [id number] Visit Date: 12/26/2021              Requested by: Elsie Stain, MD [address] [place],  [place] [address] PCP: Elsie Stain, MD  Chief Complaint  Patient presents with   Lower Back - Pain   Right Knee - Pain   Left Knee - Pain      HPI: The patient is a 46 year old gentleman seen today for 2 separate issues he comes in today complaining of midline lower back pain this has been ongoing for several years he states that he has pain that radiates down to his ankles he states he has pain with prolonged standing and working he denies any recent injury to his back no weakness of his lower extremities no red flag symptoms cannot recall any relieving factors.  He is also complaining of bilateral knee pain this is to the lateral aspect of his knees and his bilateral the right is much worse than the left.  States has been going on for the last 6 months he thinks it is related to his work he stands and must do frequent stooping and bending for work he has had some locking of his knees also describes some burning lateral pain that radiates to the anterior compartment especially on the right this is associated with some numbness in his knee anteriorly as well  He does endorse a history of bulging disks in his spine.  He has used Goody powder and Tylenol as needed as well as tried some Voltaren gel without relief  Assessment & Plan: Visit Diagnoses:  1. Chronic pain of both knees   2. Degenerative disc disease, lumbar   3. Bilateral low back pain with sciatica, sciatica laterality unspecified, unspecified chronicity     Plan: Does have a arthritic component to his knee pain bilaterally Depo-Medrol injections today.  Feel that he is having lumbar radiculopathy with sciatica right worse than left we will place him on prednisone burst.  Plan to follow-up with the patient  in 3 to 4 weeks.  Follow-Up Instructions: No follow-ups on file.   Right Knee Exam   Tenderness  The patient is experiencing tenderness in the medial joint line and lateral joint line.  Range of Motion  The patient has normal right knee ROM.  Tests  Varus: negative Valgus: negative  Other  Swelling: none   Left Knee Exam   Tenderness  The patient is experiencing tenderness in the medial joint line.  Range of Motion  The patient has normal left knee ROM.  Tests  Varus: negative Valgus: negative  Other  Swelling: none   Back Exam   Tenderness  The patient is experiencing no tenderness.   Muscle Strength  The patient has normal back strength.  Tests  Straight leg raise right: negative Straight leg raise left: negative  Other  Gait: normal       Patient is alert, oriented, no adenopathy, well-dressed, normal affect, normal respiratory effort.   Imaging: No results found. No images are attached to the encounter.  Labs: Lab Results  Component Value Date   HGBA1C 5.6 09/25/2021   HGBA1C 5.2 07/06/2018     Lab Results  Component Value Date   ALBUMIN 4.2 09/25/2021   ALBUMIN 4.0 05/02/2020  ALBUMIN 4.0 02/10/2020    No results found for: "MG" No results found for: "VD25OH"  No results found for: "PREALBUMIN"    Latest Ref Rng & Units 09/25/2021    9:51 AM 06/29/2020    3:00 PM 02/10/2020    5:58 PM  CBC EXTENDED  WBC 3.4 - 10.8 x10E3/uL 7.7  11.9  8.8   RBC 4.14 - 5.80 x10E6/uL 4.64  4.26  4.43   Hemoglobin 13.0 - 17.7 g/dL 14.1  13.4  13.8   HCT 37.5 - 51.0 % 42.2  39.2  42.1   Platelets 150 - 450 x10E3/uL 182  177  166   NEUT# 1.4 - 7.0 x10E3/uL 4.4   4.8   Lymph# 0.7 - 3.1 x10E3/uL 2.5   3.0      There is no height or weight on file to calculate BMI.  Orders:  No orders of the defined types were placed in this encounter.  No orders of the defined types were placed in this encounter.    Procedures: Large Joint Inj:  bilateral knee on 12/26/2021 2:00 PM Indications: pain Details: 18 G 1.5 in needle, anteromedial approach Medications (Right): 5 mL lidocaine 1 %; 40 mg methylPREDNISolone acetate 40 MG/ML Medications (Left): 5 mL lidocaine 1 %; 40 mg methylPREDNISolone acetate 40 MG/ML Consent was given by the patient.      Clinical Data: No additional findings.  ROS:  All other systems negative, except as noted in the HPI. Review of Systems  Objective: Vital Signs: There were no vitals taken for this visit.  Specialty Comments:  No specialty comments available.  PMFS History: Patient Active Problem List   Diagnosis Date Noted   Leg pain 11/13/2021   Other reduced mobility 09/25/2021   Coronary artery disease involving native coronary artery of native heart with other form of angina pectoris ([place]) 09/25/2021   Primary insomnia 09/25/2021   Abnormal ankle brachial index (ABI) 09/25/2021   Visual disturbance 09/25/2021   Dental caries 09/25/2021   Nonepileptic episode ([place]) 04/17/2018   Migraine with aura and without status migrainosus, not intractable 04/17/2018   Borderline personality disorder ([place]) 04/17/2018   Bipolar affective disorder, current episode depressed ([place]) 04/17/2018   CAD S/P percutaneous coronary angioplasty 03/18/2018   Essential hypertension 03/18/2018   Intracranial aneurysm 03/18/2018   History of MI (myocardial infarction) 03/18/2018   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 s/p DESx2 to RCA in 12/2015 b. low-risk NST in 03/2018   Depression    Heart attack ([place])    Hyperlipidemia    Hypertension    Migraine    Seizures ([place]) 2018   last sz 12/2017    Family History  Problem Relation Age of Onset   Diabetes Mother    Hypertension Mother    Diabetes Sister    Cancer Sister    Diabetes Maternal Aunt    Cancer Paternal Aunt    Diabetes Maternal Grandmother    Hypertension Maternal Grandmother    Hypertension  Maternal Grandfather    Cancer Maternal Grandfather        lung cancer   Hypertension Paternal Grandmother    Heart disease Father    Diabetes Father     Past Surgical History:  Procedure Laterality Date   CORONARY ANGIOPLASTY WITH STENT PLACEMENT     Social History   Occupational History    Comment: NA  Tobacco Use   Smoking status: Every Day    Packs/day: 0.25    Years: 33.00    Total pack years: 8.25    Types: Cigarettes   Smokeless tobacco: Never  Vaping Use   Vaping Use: Never used  Substance and Sexual Activity   Alcohol use: Not Currently   Drug use: Not Currently    Types: Marijuana, Methamphetamines   Sexual activity: Not Currently

## 2021-12-31 ENCOUNTER — Ambulatory Visit: Payer: Self-pay | Admitting: Family Medicine

## 2022-01-02 ENCOUNTER — Telehealth: Payer: Self-pay | Admitting: Family

## 2022-01-02 MED ORDER — PREDNISONE 10 MG PO TABS: 10.0000 mg | ORAL_TABLET | Freq: Every day | ORAL | 0 refills | Status: DC

## 2022-01-02 NOTE — Telephone Encounter (Signed)
Patient called. Says the prednisone is working. Need a refill on it. His call back number is [phone]

## 2022-01-02 NOTE — Telephone Encounter (Signed)
You saw this pt on 12/26/2021 please see below.

## 2022-01-13 ENCOUNTER — Other Ambulatory Visit: Payer: Self-pay | Admitting: Critical Care Medicine

## 2022-01-15 NOTE — Telephone Encounter (Signed)
Requested Prescriptions  Pending Prescriptions Disp Refills  . diclofenac Sodium (VOLTAREN) 1 % GEL [Pharmacy Med Name: DICLOFENAC SODIUM 1% GEL] 100 g 1    Sig: APPLY 4 GM 4 TIMES DAILY. TO BOTH KNEES     Analgesics:  Topicals Failed - 01/13/2022 11:51 AM      Failed - Manual Review: Labs are only required if the patient has taken medication for more than 8 weeks.      Passed - PLT in normal range and within 360 days    Platelets  Date Value Ref Range Status  09/25/2021 182 150 - 450 x10E3/uL Final         Passed - HGB in normal range and within 360 days    Hemoglobin  Date Value Ref Range Status  09/25/2021 14.1 13.0 - 17.7 g/dL Final         Passed - HCT in normal range and within 360 days    Hematocrit  Date Value Ref Range Status  09/25/2021 42.2 37.5 - 51.0 % Final         Passed - Cr in normal range and within 360 days    Creatinine, Ser  Date Value Ref Range Status  09/25/2021 0.95 0.76 - 1.27 mg/dL Final         Passed - eGFR is 30 or above and within 360 days    GFR calc Af Amer  Date Value Ref Range Status  02/10/2020 >60 >60 mL/min Final   GFR, Estimated  Date Value Ref Range Status  06/29/2020 >60 >60 mL/min Final    Comment:    (NOTE) Calculated using the CKD-EPI Creatinine Equation (2021)    eGFR  Date Value Ref Range Status  09/25/2021 100 >59 mL/min/1.73 Final         Passed - Patient is not pregnant      Passed - Valid encounter within last 12 months    Recent Outpatient Visits          1 month ago Lumbosacral radiculopathy   [place], Patrick E, MD   3 months ago Essential hypertension   [place], MD      Future Appointments            In 2 weeks Suzan Slick, NP [place]   In 3 weeks [place], Alphonse Guild, MD [place] [place] [facility]

## 2022-01-17 ENCOUNTER — Ambulatory Visit: Payer: Commercial Managed Care - PPO | Admitting: Student

## 2022-01-29 ENCOUNTER — Ambulatory Visit: Payer: Commercial Managed Care - PPO | Admitting: Family

## 2022-01-30 ENCOUNTER — Ambulatory Visit: Payer: Commercial Managed Care - PPO | Admitting: Family

## 2022-01-31 ENCOUNTER — Other Ambulatory Visit: Payer: Self-pay | Admitting: Family

## 2022-02-06 ENCOUNTER — Ambulatory Visit: Payer: Commercial Managed Care - PPO | Admitting: Cardiology

## 2022-02-12 ENCOUNTER — Ambulatory Visit: Payer: Commercial Managed Care - PPO | Admitting: Family

## 2022-03-07 ENCOUNTER — Other Ambulatory Visit: Payer: Self-pay | Admitting: Orthopedic Surgery

## 2022-03-25 NOTE — Progress Notes (Unsigned)
Cardiology Office Note    Date:  03/26/2022   ID:  Brad Randolph, DOB 1976/03/23, MRN [id number]  PCP:  Brad Stain, MD  Cardiologist:  Carlyle Dolly, MD  Electrophysiologist:  None   Chief Complaint: f/u CAD  History of Present Illness:   Brad Randolph (pronounced lone-ess) is a 46 y.o. male with history of CAD (NSTEMI 12/2015 @ [facility] s/p DESx2 to RCA), HTN, HLD, arthritis on chronic prednisone per ortho per patient, depression, anxiety, bipolar disorder, borderline personality disorder, remote seizures, migraines who is seen for overdue follow-up. He had remote NSTEMI with cath in 2017 at outside hospital with cath report showing disease in the RCA (Right coronary-90% proximal at the takeoff of the RV marginal branch; 70% mid stenosis; PDA-normal) but otherwise normal coronaries, EF 55-60%. 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 Notes indicate in the past that neuro felt he no longer needed DAPT so has been on ASA monotherapy. Last echo 08/2017 showed normal EF, no major findings. Nuclear stress test 03/2018 was low risk. Carotid US 2018 unremarkable along with LE ABIs 10/2021. Last OV was via tele-med in 2021.  He returns today feeling well without any new complaints. No CP, SOB, palpitations, orthopnea, dyspnea, edema, dizziness, syncope. Tolerating meds without complaint. Gets a lot of exercise in with his job and chasing grandkids. Initial BP by CMA 102/78, recheck by me 112/70. He is seen in the [place] office because he recently moved to [place] and wishes to change locations. He would like to f/u with our [employer] location going forward if possible.   Labwork independently reviewed: 08/2021 LDL 58, trig 59, A1C 5.6 Hgb 14.1, plt 182, K 4.3, Cr 0.95, LFTs ok 2019 TSH wnl  Cardiology Studies:   Studies reviewed are outlined and summarized above. Reports included below if pertinent.   ABIs 10/2021 Summary:  Right: Resting right  ankle-brachial index is within normal range. No  evidence of significant right lower extremity arterial disease. The right  toe-brachial index is normal.   Left: Resting left ankle-brachial index is within normal range. No  evidence of significant left lower extremity arterial disease. The left  toe-brachial index is normal.   NST 03/2018   There was no ST segment deviation noted during stress. The study is normal. There are no perfusion defects consistent with prior infarct or current ischemia. This is a low risk study. The left ventricular ejection fraction is normal (55-65%).    2D echo CareEverywhere 08/2017 SUMMARY  The left ventricular size is normal.   Left ventricular systolic function is normal.  LV ejection fraction = 55-60%.   Left ventricular filling pattern is normal.  The right ventricle is normal in size and function.  There is no significant valvular stenosis or regurgitation  IVC size was mildly dilated.  There is no pericardial effusion.  There is no significant change in comparison with the last study.  -  FINDINGS:   LEFT VENTRICLE  The left ventricular size is normal. There is normal left ventricular wall  thickness. Left ventricular systolic function is normal. LV ejection fraction  = 55-60%. Left ventricular filling pattern is normal. No segmental wall  motion abnormalities seen in the left ventricle.  -   RIGHT VENTRICLE  The right ventricle is normal in size and function.   LEFT ATRIUM  The left atrial size is normal.   RIGHT ATRIUM   Right atrial size is normal. Injection of agitated  saline showed no  right-to-left shunt.  -  AORTIC VALVE  There is aortic valve sclerosis. The aortic valve opens well. The aortic  valve is trileaflet. There is no aortic stenosis. There is no aortic  regurgitation.  -  MITRAL VALVE  The mitral valve leaflets appear normal. There is trace mitral regurgitation.  -  TRICUSPID VALVE  Structurally normal  tricuspid valve. There is trace tricuspid regurgitation.  -  PULMONIC VALVE  The pulmonic valve is not well visualized. There is no pulmonic valvular  regurgitation.  -  ARTERIES  The aortic root is normal size.  -  VENOUS  Pulmonary veins were not well visualized during exam. Pulmonary venous flow  pattern is normal. IVC size was mildly dilated.  -  EFFUSION  There is no pericardial effusion.    Carotid duplex CareEverywhere 2018  No evidence of hemodynamically significant stenosis in either carotid system as described.    CT Angio pulm 2018  FINDINGS: Heart and great vessels stable. No lymphadenopathy. Pulmonary arteries and proximal branches patent. No filling defects identified. No acute infiltrate or effusion. Upper abdomen stable.   Cath 01/2016 PROCEDURE: Left heart catheterization, selective coronary angiography , PCI of the proximal and mid RCA  and left ventriculography   PREPROCEDURE DIAGNOSIS: Non-ST segment elevation MI   POSTPROCEDURE DIAGNOSIS: CAD   COMPLICATIONS: None.     Diagnostic catheter-5 Jobe Marker, guide catheter was a 6 [place] JR 4, guidewire 0.014 All-Star wire; proximal stent was a 2.75 x 28 mm Synergy DES; mid stent was a 2.5 x 20 mm Synergy DES   ACCESS: Right radial   PROCEDURE IN DETAIL:   After discussing the risks and benefits of the procedure in detail, a written consent was obtained.  Patient was then brought into the cardiac catheterization laboratory. Both groins were prepped and draped in usual sterile fashion. Right wrist was also prepped and draped in usual sterile fashion. Right radial arterial site was then anesthetized with 2%   lidocaine. Using a modified Seldinger technique a 5 French right radial artery sheath was placed and was flushed with 10 cc of normal saline. Patient was then given total of 2.5 mg of Verapamil via the sheath. Patient was also given total of 2500 units  of heparin intravenously after the placement of the  sheath and crossing the arch successfully. The patient was given an additional 6000 units of IV heparin as well as 180 mg of by mouth Brilinta after the decision was made to proceed with PCI of the RCA.  Then using a 5 [place] Tiger catheter left ventriculogram was performed and pressures were obtained before and after the left angiogram. The catheter was then withdrawn across the aortic valve and pullback pressures were obtained.  Using the same catheter selective left and right coronary ostia were engaged respectively and multiple views were obtained. After diagnostic angiography was completed the 6 [place] JR 4 guide was placed coaxial to the ostium of the right coronary artery.  I used a guidewire to cross both the proximal and mid stenoses with minimal difficulty. I then placed a 2.5 x 20 mm Synergy drug-eluting stent in the mid vessel and deployed this at 13 atm for 60 seconds. After this was completed I removed the stent  balloon through the guide catheter and then placed the 2.75 x 28 mm Synergy drug-eluting stent from proximal to mid vessel and deployed this stent at 15 atm for 90 seconds. Both lesions were type A lesions that had  TIMI III flow both before and after the   intervention. After the intervention was completed the guidewire and stent balloon was removed through the guide catheter and final guide shots were taken in 2 orthogonal views. There was no evidence of dissection, distal vessel perforation or  thrombosis. The patient tolerated the procedure well without any complications. The right radial arterial sheath was then removed and the cardiac catheterization laboratory and the site was closed using TR band with good hemostasis.   RESULTS:   Coronary arteriograms  1. Left main-normal  2. Left anterior descending-normal; D1-normal; D2-normal  3. Left circumflex-normal; OM1-normal; OM 2-normal  4. Right coronary-90% proximal at the takeoff of the RV marginal branch; 70% mid stenosis;  PDA-normal   Left ventriculography  1. Normal left ventricular chamber size.  2. Normal wall motion.  3. The estimated EF is 55-60%.   CONCLUSIONS:   1. Single vessel native coronary artery disease.  2. Successful PCI with placement of 2 drug-eluting stents in the proximal and mid RCA.  3. Normal left ventricular chamber size with normal wall motion, the estimated EF is 55-60%.     Past Medical History:  Diagnosis Date   Anxiety    Asthma    chilldhood   Bipolar disorder ([place])    Borderline personality disorder ([place])    CAD (coronary artery disease)    a. s/p DESx2 to RCA in 12/2015 b. low-risk NST in 03/2018   Depression    Heart attack ([place])    Hyperlipidemia    Hypertension    Migraine    Seizures ([place]) 2018   last sz 12/2017    Past Surgical History:  Procedure Laterality Date   CORONARY ANGIOPLASTY WITH STENT PLACEMENT      Current Medications: Current Meds  Medication Sig   amLODipine (NORVASC) 5 MG tablet Take 1 tablet (5 mg total) by mouth daily.   ASPIRIN LOW DOSE 81 MG EC tablet TAKE 1 Tablet BY MOUTH ONCE DAILY   atorvastatin (LIPITOR) 40 MG tablet Take 1 tablet (40 mg total) by mouth daily.   diclofenac Sodium (VOLTAREN) 1 % GEL APPLY 4 GM 4 TIMES DAILY. TO BOTH KNEES   hydrOXYzine (VISTARIL) 25 MG capsule Take 25 mg by mouth daily as needed.   isosorbide mononitrate (IMDUR) 30 MG 24 hr tablet Take 1 tablet (30 mg total) by mouth daily.   metoprolol tartrate (LOPRESSOR) 25 MG tablet Take 1 tablet (25 mg total) by mouth 2 (two) times daily.   Paliperidone ER (INVEGA SUSTENNA) injection Inject into the muscle every 30 (thirty) days.   predniSONE (DELTASONE) 10 MG tablet TAKE 1 TABLET (10 MG TOTAL) BY MOUTH DAILY WITH BREAKFAST.   sertraline (ZOLOFT) 25 MG tablet Take 25 mg by mouth daily.     Allergies:   Soap, Tramadol, Mushroom extract complex, Other, and Trazodone and nefazodone   Social History   Socioeconomic History   Marital status: Married     Spouse name: Not on file   Number of children: 2   Years of education: 11   Highest education level: Not on file  Occupational History    Comment: NA  Tobacco Use   Smoking status: Every Day    Packs/day: 0.25    Years: 33.00    Total pack years: 8.25    Types: Cigarettes   Smokeless tobacco: Never  Vaping Use   Vaping Use: Never used  Substance and Sexual Activity   Alcohol use: Not Currently   Drug use:  Not Currently    Types: Marijuana, Methamphetamines   Sexual activity: Not Currently  Other Topics Concern   Not on file  Social History Narrative   Lives with sister Lori's daughter, his neice   Tea, some Coke   Social Determinants of Health   Financial Resource Strain: Not on file  Food Insecurity: Not on file  Transportation Needs: Not on file  Physical Activity: Not on file  Stress: Not on file  Social Connections: Not on file     Family History:  The patient's family history includes Cancer in his maternal grandfather, paternal aunt, and sister; Diabetes in his father, maternal aunt, maternal grandmother, mother, and sister; Heart disease in his father; Hypertension in his maternal grandfather, maternal grandmother, mother, and paternal grandmother.  ROS:   Please see the history of present illness.  All other systems are reviewed and otherwise negative.    EKG(s)/Additional Labs   EKG:  EKG is ordered today, personally reviewed, demonstrating NSR 68bpm, no acute STT changes. Normal intervals.   Recent Labs: 09/25/2021: ALT 18; BUN 10; Creatinine, Ser 0.95; Hemoglobin 14.1; Platelets 182; Potassium 4.3; Sodium 138  Recent Lipid Panel    Component Value Date/Time   CHOL 105 09/25/2021 0951   TRIG 59 09/25/2021 0951   HDL 34 (L) 09/25/2021 0951   CHOLHDL 3.1 09/25/2021 0951   CHOLHDL 3.5 05/02/2020 1311   VLDL 15 05/02/2020 1311   LDLCALC 58 09/25/2021 0951    PHYSICAL EXAM:    VS:  BP 102/78 (BP Location: Left Arm, Patient Position: Sitting, Cuff  Size: Normal)   Pulse 68   Ht 5' 7"  (1.702 m)   Wt 206 lb 9.6 oz (93.7 kg)   SpO2 98%   BMI 32.36 kg/m   BMI: Body mass index is 32.36 kg/m.  GEN: Well nourished, well developed male in no acute distress, tattered posterior cshirt HEENT: normocephalic, atraumatic Neck: no JVD, carotid bruits, or masses Cardiac: RRR; no murmurs, rubs, or gallops, no edema  Respiratory:  clear to auscultation bilaterally, normal work of breathing GI: soft, nontender, nondistended, + BS MS: no deformity or atrophy Skin: warm and dry, no rash Neuro:  Alert and Oriented x 3, Strength and sensation are intact, follows commands Psych: euthymic mood, full affect  Wt Readings from Last 3 Encounters:  03/26/22 206 lb 9.6 oz (93.7 kg)  12/04/21 188 lb 3.2 oz (85.4 kg)  11/13/21 185 lb (83.9 kg)     ASSESSMENT & PLAN:   1. CAD - continue current regimen, doing well without acute concerns. Continue ASA, metoprolol, Imdur, amlodipine and atorvastatin.  2. Essential HTN - initial BP check by CMA was low normal with recheck by me at goal. No changes made today.  3. Hyperlipidemia - last labs by PCP ([facility]) reviewed from 08/2021 and LDL at goal. Continue atorvastatin at present dose. Encouraged him to f/u PCP around 08/2021 for repeat labs since they are following. He agrees with this plan.  4. H/o migraines - patient does not report any recent concerns. If this becomes an issue in the future, could consider downtitrating Imdur as tolerated.     Disposition: [facility]u with [facility] in 1 year. Patient would like to establish in [facility] location.   Medication Adjustments/Labs and Tests Ordered: Current medicines are reviewed at length with the patient today.  Concerns regarding medicines are outlined above. Medication changes, Labs and Tests ordered today are summarized above and listed in the Patient Instructions accessible in Encounters.  Signed, Charlie Pitter, PA-C  03/26/2022 3:56 PM    [place] Phone: [phone]; Fax: [phone]

## 2022-03-26 ENCOUNTER — Ambulatory Visit: Payer: Commercial Managed Care - PPO | Attending: Student | Admitting: Physician Assistant

## 2022-03-26 ENCOUNTER — Encounter: Payer: Self-pay | Admitting: Physician Assistant

## 2022-03-26 ENCOUNTER — Telehealth: Payer: Self-pay | Admitting: Physician Assistant

## 2022-03-26 VITALS — BP 112/70 | HR 68 | Ht 67.0 in | Wt 206.6 lb

## 2022-03-26 DIAGNOSIS — E785 Hyperlipidemia, unspecified: Secondary | ICD-10-CM

## 2022-03-26 DIAGNOSIS — I2510 Atherosclerotic heart disease of native coronary artery without angina pectoris: Secondary | ICD-10-CM

## 2022-03-26 DIAGNOSIS — G43909 Migraine, unspecified, not intractable, without status migrainosus: Secondary | ICD-10-CM | POA: Diagnosis not present

## 2022-03-26 DIAGNOSIS — I10 Essential (primary) hypertension: Secondary | ICD-10-CM

## 2022-03-26 NOTE — Telephone Encounter (Signed)
To Dr. Harl Bowie - this is a patient you previously followed who recently moved to [facility]. I saw him for 1 year f/u in [place] and he is doing well.  I recommended 1 year follow-up. He would like to f/u in our [facility] location. I do not presently know who he would see since this is so far out, just need the OK from your end per office policy that you approve to change MDs when he goes to schedule the next visit.

## 2022-03-26 NOTE — Patient Instructions (Signed)
Medication Instructions:  Your physician recommends that you continue on your current medications as directed. Please refer to the Current Medication list given to you today. *If you need a refill on your cardiac medications before your next appointment, please call your pharmacy*   Lab Work: None Ordered   Testing/Procedures: None Ordered   Follow-Up: At [facility], you and your health needs are our priority.  As part of our continuing mission to provide you with exceptional heart care, we have created designated Provider Care Teams.  These Care Teams include your primary Cardiologist (physician) and Advanced Practice Providers (APPs -  Physician Assistants and Nurse Practitioners) who all work together to provide you with the care you need, when you need it.  We recommend signing up for the patient portal called "MyChart".  Sign up information is provided on this After Visit Summary.  MyChart is used to connect with patients for Virtual Visits (Telemedicine).  Patients are able to view lab/test results, encounter notes, upcoming appointments, etc.  Non-urgent messages can be sent to your provider as well.   To learn more about what you can do with MyChart, go to [email or web address].    Your next appointment:   1 year(s)  The format for your next appointment:   In Person  Provider:   Jenne Campus, MD, Shirlee More, MD, or Jyl Heinz, MD   Other Instructions   Important Information About Sugar

## 2022-08-06 ENCOUNTER — Telehealth: Payer: Self-pay | Admitting: Critical Care Medicine

## 2022-08-06 DIAGNOSIS — E782 Mixed hyperlipidemia: Secondary | ICD-10-CM

## 2022-08-06 DIAGNOSIS — I252 Old myocardial infarction: Secondary | ICD-10-CM

## 2022-08-06 DIAGNOSIS — I10 Essential (primary) hypertension: Secondary | ICD-10-CM

## 2022-08-06 NOTE — Telephone Encounter (Signed)
Called patient and he is aware and appointment scheduled

## 2022-08-06 NOTE — Telephone Encounter (Signed)
Received request for lab to go to [facility] Fax [phone]  updated lipid panel last labs 08/2021     Placed order carly pls get pt in for labs

## 2022-08-13 ENCOUNTER — Ambulatory Visit: Payer: MEDICAID | Attending: Critical Care Medicine

## 2022-08-13 DIAGNOSIS — E782 Mixed hyperlipidemia: Secondary | ICD-10-CM

## 2022-08-13 DIAGNOSIS — I10 Essential (primary) hypertension: Secondary | ICD-10-CM

## 2022-08-13 DIAGNOSIS — I252 Old myocardial infarction: Secondary | ICD-10-CM

## 2022-08-14 ENCOUNTER — Other Ambulatory Visit: Payer: Self-pay | Admitting: Critical Care Medicine

## 2022-08-14 LAB — BMP8+EGFR
BUN/Creatinine Ratio: 13 (ref 9–20)
BUN: 11 mg/dL (ref 6–24)
CO2: 22 mmol/L (ref 20–29)
Calcium: 9.5 mg/dL (ref 8.7–10.2)
Chloride: 104 mmol/L (ref 96–106)
Creatinine, Ser: 0.87 mg/dL (ref 0.76–1.27)
Glucose: 108 mg/dL — ABNORMAL HIGH (ref 70–99)
Potassium: 4.3 mmol/L (ref 3.5–5.2)
Sodium: 141 mmol/L (ref 134–144)
eGFR: 107 mL/min/{1.73_m2} (ref >59)

## 2022-08-14 LAB — LIPID PANEL
Chol/HDL Ratio: 6.1 ratio — ABNORMAL HIGH (ref 0.0–5.0)
Cholesterol, Total: 209 mg/dL — ABNORMAL HIGH (ref 100–199)
HDL: 34 mg/dL — ABNORMAL LOW (ref >39)
LDL Chol Calc (NIH): 132 mg/dL — ABNORMAL HIGH (ref 0–99)
Triglycerides: 239 mg/dL — ABNORMAL HIGH (ref 0–149)
VLDL Cholesterol Cal: 43 mg/dL — ABNORMAL HIGH (ref 5–40)

## 2022-08-14 MED ORDER — ATORVASTATIN CALCIUM 40 MG PO TABS: 40.0000 mg | ORAL_TABLET | Freq: Every day | ORAL | 3 refills | Status: DC

## 2022-08-14 NOTE — Progress Notes (Signed)
Let pt know cholesterol very high pt needs to stay on atorvastatin he needs this medication refill sent

## 2022-08-15 ENCOUNTER — Telehealth: Payer: Self-pay

## 2022-08-15 NOTE — Telephone Encounter (Signed)
Pt was called and is aware of results, DOB was confirmed.  ?

## 2022-08-15 NOTE — Telephone Encounter (Signed)
-----   Message from Elsie Stain, MD sent at 08/14/2022  6:07 AM EST ----- Let pt know cholesterol very high pt needs to stay on atorvastatin he needs this medication refill sent

## 2022-10-10 ENCOUNTER — Other Ambulatory Visit: Payer: Self-pay | Admitting: Critical Care Medicine

## 2022-10-25 ENCOUNTER — Other Ambulatory Visit: Payer: Self-pay | Admitting: Critical Care Medicine

## 2022-10-25 NOTE — Telephone Encounter (Signed)
Requested medications are due for refill today.  yes  Requested medications are on the active medications list.  yes  Last refill. 09/25/2021 #180 2 rf  Future visit scheduled.   no  Notes to clinic.  Pt is more than 3 months over due for OV.    Requested Prescriptions  Pending Prescriptions Disp Refills   metoprolol tartrate (LOPRESSOR) 25 MG tablet [Pharmacy Med Name: METOPROLOL TARTRATE 25MG  TABLETS] 180 tablet 2    Sig: TAKE 1 TABLET BY MOUTH TWICE DAILY     Cardiovascular:  Beta Blockers Failed - 10/25/2022  5:30 PM      Failed - Valid encounter within last 6 months    Recent Outpatient Visits           10 months ago Lumbosacral radiculopathy   [facility] Storm Frisk, MD   1 year ago Essential hypertension   [facility] Storm Frisk, MD              Passed - Last BP in normal range    BP Readings from Last 1 Encounters:  03/26/22 112/70         Passed - Last Heart Rate in normal range    Pulse Readings from Last 1 Encounters:  03/26/22 68

## 2022-10-28 ENCOUNTER — Other Ambulatory Visit: Payer: Self-pay | Admitting: Critical Care Medicine

## 2022-10-28 NOTE — Telephone Encounter (Signed)
Medication Refill - Medication: metoprolol tartrate (LOPRESSOR) 25 MG tablet   Pt asked if it was possible to get a courtesy refill to get him through until his appointment. Please advise.   Has the patient contacted their pharmacy? Yes.    (Agent: If yes, when and what did the pharmacy advise?)  Preferred Pharmacy (with phone number or street name):  [id number] - [place], [place] - [address] [facility] [address]  [address] [place] [address]  Phone: [phone] Fax: [phone]  Hours: Not open 24 hours   Has the patient been seen for an appointment in the last year OR does the patient have an upcoming appointment? Yes.    Agent: Please be advised that RX refills may take up to 3 business days. We ask that you follow-up with your pharmacy.

## 2022-10-28 NOTE — Telephone Encounter (Signed)
Patient request short supply until OV 515/24.

## 2022-10-29 MED ORDER — METOPROLOL TARTRATE 25 MG PO TABS: 25.0000 mg | ORAL_TABLET | Freq: Two times a day (BID) | ORAL | 0 refills | Status: DC

## 2022-10-29 NOTE — Telephone Encounter (Signed)
Future visit in 2 weeks. Courtesy refill until appt 11/13/22. Will give #32 0 refills.  Requested Prescriptions  Pending Prescriptions Disp Refills   metoprolol tartrate (LOPRESSOR) 25 MG tablet 32 tablet 0    Sig: Take 1 tablet (25 mg total) by mouth 2 (two) times daily.     Cardiovascular:  Beta Blockers Failed - 10/28/2022  2:23 PM      Failed - Valid encounter within last 6 months    Recent Outpatient Visits           10 months ago Lumbosacral radiculopathy   [facility] Brad Frisk, Brad Randolph   1 year ago Essential hypertension   [facility] Brad Frisk, Brad Randolph       Future Appointments             In 2 weeks Brad Randolph [facility]            Passed - Last BP in normal range    BP Readings from Last 1 Encounters:  03/26/22 112/70         Passed - Last Heart Rate in normal range    Pulse Readings from Last 1 Encounters:  03/26/22 68

## 2022-11-08 ENCOUNTER — Other Ambulatory Visit: Payer: Self-pay | Admitting: Critical Care Medicine

## 2022-11-12 ENCOUNTER — Other Ambulatory Visit: Payer: Self-pay | Admitting: Critical Care Medicine

## 2022-11-13 ENCOUNTER — Ambulatory Visit: Payer: Commercial Managed Care - PPO | Admitting: Physician Assistant

## 2022-12-04 ENCOUNTER — Encounter: Payer: Self-pay | Admitting: Physician Assistant

## 2022-12-04 ENCOUNTER — Ambulatory Visit: Payer: Commercial Managed Care - PPO | Attending: Physician Assistant | Admitting: Physician Assistant

## 2022-12-04 VITALS — BP 105/70 | HR 84

## 2022-12-04 DIAGNOSIS — E782 Mixed hyperlipidemia: Secondary | ICD-10-CM

## 2022-12-04 DIAGNOSIS — I252 Old myocardial infarction: Secondary | ICD-10-CM | POA: Diagnosis not present

## 2022-12-04 DIAGNOSIS — I10 Essential (primary) hypertension: Secondary | ICD-10-CM | POA: Diagnosis not present

## 2022-12-04 DIAGNOSIS — J9801 Acute bronchospasm: Secondary | ICD-10-CM

## 2022-12-04 MED ORDER — ASPIRIN 81 MG PO TBEC: 81.0000 mg | DELAYED_RELEASE_TABLET | Freq: Every day | ORAL | 1 refills | Status: AC

## 2022-12-04 MED ORDER — ALBUTEROL SULFATE HFA 108 (90 BASE) MCG/ACT IN AERS: INHALATION_SPRAY | RESPIRATORY_TRACT | 1 refills | Status: AC

## 2022-12-04 MED ORDER — METOPROLOL TARTRATE 25 MG PO TABS: 25.0000 mg | ORAL_TABLET | Freq: Two times a day (BID) | ORAL | 1 refills | Status: DC

## 2022-12-04 MED ORDER — AMLODIPINE BESYLATE 5 MG PO TABS: 5.0000 mg | ORAL_TABLET | Freq: Every day | ORAL | 1 refills | Status: DC

## 2022-12-04 MED ORDER — ISOSORBIDE MONONITRATE ER 30 MG PO TB24: 30.0000 mg | ORAL_TABLET | Freq: Every day | ORAL | 1 refills | Status: DC

## 2022-12-04 MED ORDER — ATORVASTATIN CALCIUM 40 MG PO TABS: 40.0000 mg | ORAL_TABLET | Freq: Every day | ORAL | 3 refills | Status: DC

## 2022-12-04 NOTE — Progress Notes (Signed)
Patient ID: Brad Randolph, male   DOB: 10-29-1975, 47 y.o.   MRN: [id number]   Brad Randolph, is a 47 y.o. male  [id number]  [id number]  DOB - 06-26-1976  Chief Complaint  Patient presents with   Hypertension       Subjective:   Brad Randolph is a 47 y.o. male here today for med RF.  No CP.  Occasional wheezing.  He has not been taking cholesterol meds for a couple weeks.  He is due to see cardiology.  Denies any issues or concerns.  Has [facility] provider for psych meds.  Denies SI/HI.  Meds are working  No problems updated.  ALLERGIES: Allergies  Allergen Reactions   Soap Rash    Dial soap peels skin  Dial soap peels skin  Dial soap peels skin     Tramadol Other (See Comments)    "mean" and aggressive per pt "mean" and aggressive per pt "mean" and aggressive per pt aggression     Mushroom Extract Complex Diarrhea and Nausea And Vomiting   Other Hives    Dial Soap. - breaks out and blisters   Trazodone And Nefazodone Other (See Comments)    Violent    PAST MEDICAL HISTORY: Past Medical History:  Diagnosis Date   Anxiety    Asthma    chilldhood   Bipolar disorder (HCC)    Borderline personality disorder (HCC)    CAD (coronary artery disease)    a. s/p DESx2 to RCA in 12/2015 b. low-risk NST in 03/2018   Depression    Heart attack (HCC)    Hyperlipidemia    Hypertension    Migraine    Seizures (HCC) 2018   last sz 12/2017    MEDICATIONS AT HOME: Prior to Admission medications   Medication Sig Start Date End Date Taking? Authorizing Provider  Paliperidone ER (INVEGA SUSTENNA) injection Inject into the muscle every 30 (thirty) days.   Yes [provider]  albuterol (PROVENTIL HFA) 108 (90 Base) MCG/ACT inhaler INHALE 2 PUFFS BY MOUTH EVERY 6 HOURS AS NEEDED FOR COUGHING, WHEEZING, OR SHORTNESS OF BREATH 12/04/22   Georgian Co M, PA-C  amLODipine (NORVASC) 5 MG tablet Take 1 tablet (5 mg total) by mouth daily. 12/04/22   Anders Simmonds, PA-C  aspirin  EC (ASPIRIN LOW DOSE) 81 MG tablet Take 1 tablet (81 mg total) by mouth daily. Swallow whole. 12/04/22   Anders Simmonds, PA-C  atorvastatin (LIPITOR) 40 MG tablet Take 1 tablet (40 mg total) by mouth daily. 12/04/22   Anders Simmonds, PA-C  diclofenac Sodium (VOLTAREN) 1 % GEL APPLY 4 GM 4 TIMES DAILY. TO BOTH KNEES Patient not taking: Reported on 12/04/2022 01/15/22   Storm Frisk, MD  hydrOXYzine (VISTARIL) 25 MG capsule Take 25 mg by mouth daily as needed. Patient not taking: Reported on 12/04/2022 03/06/22   [provider]  isosorbide mononitrate (IMDUR) 30 MG 24 hr tablet Take 1 tablet (30 mg total) by mouth daily. 12/04/22   Anders Simmonds, PA-C  metoprolol tartrate (LOPRESSOR) 25 MG tablet Take 1 tablet (25 mg total) by mouth 2 (two) times daily. 12/04/22   Anders Simmonds, PA-C  predniSONE (DELTASONE) 10 MG tablet TAKE 1 TABLET (10 MG TOTAL) BY MOUTH DAILY WITH BREAKFAST. Patient not taking: Reported on 12/04/2022 03/08/22   Nadara Mustard, MD  sertraline (ZOLOFT) 25 MG tablet Take 25 mg by mouth daily. Patient not taking: Reported on 12/04/2022 10/26/21   [provider]  ROS: Neg HEENT Neg resp Neg cardiac Neg GI Neg GU Neg MS Neg psych Neg neuro  Objective:   Vitals:   12/04/22 1341  BP: 105/70  Pulse: 84  SpO2: 98%   Exam General appearance : Awake, alert, not in any distress. Speech Clear. Not toxic looking HEENT: Atraumatic and Normocephalic Neck: Supple, no JVD. No cervical lymphadenopathy.  Chest: Good air entry bilaterally, CTAB.  No rales/rhonchi. Mild wheezing CVS: S1 S2 regular, no murmurs.  Extremities: B/L Lower Ext shows no edema, both legs are warm to touch Neurology: Awake alert, and oriented X 3, CN II-XII intact, Non focal Skin: No Rash  Data Review Lab Results  Component Value Date   HGBA1C 5.6 09/25/2021   HGBA1C 5.2 07/06/2018    Assessment & Plan   1. Essential hypertension controlled - amLODipine (NORVASC) 5 MG tablet;  Take 1 tablet (5 mg total) by mouth daily.  Dispense: 90 tablet; Refill: 1 - metoprolol tartrate (LOPRESSOR) 25 MG tablet; Take 1 tablet (25 mg total) by mouth 2 (two) times daily.  Dispense: 180 tablet; Refill: 1  2. History of MI (myocardial infarction) No CP.  Make appt with cARDIOlogy - isosorbide mononitrate (IMDUR) 30 MG 24 hr tablet; Take 1 tablet (30 mg total) by mouth daily.  Dispense: 90 tablet; Refill: 1 - aspirin EC (ASPIRIN LOW DOSE) 81 MG tablet; Take 1 tablet (81 mg total) by mouth daily. Swallow whole.  Dispense: 100 tablet; Refill: 1  3. Bronchospasm - albuterol (PROVENTIL HFA) 108 (90 Base) MCG/ACT inhaler; INHALE 2 PUFFS BY MOUTH EVERY 6 HOURS AS NEEDED FOR COUGHING, WHEEZING, OR SHORTNESS OF BREATH  Dispense: 18 g; Refill: 1  4. Mixed hyperlipidemia Has been off and non-fasting today - atorvastatin (LIPITOR) 40 MG tablet; Take 1 tablet (40 mg total) by mouth daily.  Dispense: 90 tablet; Refill: 3    Return in about 4 months (around 04/05/2023) for PCP for chronic conditions.  The patient was given clear instructions to go to ER or return to medical center if symptoms don't improve, worsen or new problems develop. The patient verbalized understanding. The patient was told to call to get lab results if they haven't heard anything in the next week.      Georgian Co, PA-C [facility] [place], [place] [phone]   12/04/2022, 1:50 PM

## 2023-04-09 ENCOUNTER — Ambulatory Visit: Payer: Commercial Managed Care - PPO | Admitting: Family Medicine

## 2023-04-09 ENCOUNTER — Ambulatory Visit: Payer: Commercial Managed Care - PPO | Attending: Critical Care Medicine | Admitting: Family Medicine

## 2023-04-09 ENCOUNTER — Encounter: Payer: Self-pay | Admitting: Family Medicine

## 2023-04-09 VITALS — BP 113/76 | HR 73 | Ht 67.0 in | Wt 207.0 lb

## 2023-04-09 DIAGNOSIS — E782 Mixed hyperlipidemia: Secondary | ICD-10-CM | POA: Diagnosis not present

## 2023-04-09 DIAGNOSIS — I252 Old myocardial infarction: Secondary | ICD-10-CM | POA: Diagnosis not present

## 2023-04-09 DIAGNOSIS — I10 Essential (primary) hypertension: Secondary | ICD-10-CM | POA: Diagnosis not present

## 2023-04-09 DIAGNOSIS — M1990 Unspecified osteoarthritis, unspecified site: Secondary | ICD-10-CM

## 2023-04-09 DIAGNOSIS — F3130 Bipolar disorder, current episode depressed, mild or moderate severity, unspecified: Secondary | ICD-10-CM

## 2023-04-09 DIAGNOSIS — I25118 Atherosclerotic heart disease of native coronary artery with other forms of angina pectoris: Secondary | ICD-10-CM

## 2023-04-09 DIAGNOSIS — F17200 Nicotine dependence, unspecified, uncomplicated: Secondary | ICD-10-CM

## 2023-04-09 MED ORDER — METOPROLOL TARTRATE 25 MG PO TABS: 25.0000 mg | ORAL_TABLET | Freq: Two times a day (BID) | ORAL | 1 refills | Status: AC

## 2023-04-09 MED ORDER — MELOXICAM 7.5 MG PO TABS: 7.5000 mg | ORAL_TABLET | Freq: Every day | ORAL | 1 refills | Status: DC

## 2023-04-09 MED ORDER — AMLODIPINE BESYLATE 5 MG PO TABS: 5.0000 mg | ORAL_TABLET | Freq: Every day | ORAL | 1 refills | Status: AC

## 2023-04-09 MED ORDER — ISOSORBIDE MONONITRATE ER 30 MG PO TB24: 30.0000 mg | ORAL_TABLET | Freq: Every day | ORAL | 1 refills | Status: DC

## 2023-04-09 NOTE — Patient Instructions (Signed)
Arthritis Arthritis is a term that is commonly used to refer to joint pain or joint disease. There are more than 100 types of arthritis. What are the causes? The most common cause of this condition is wear and tear of a joint. Other causes include: Gout. Inflammation of a joint. An infection of a joint. Sprains and other injuries near the joint. A reaction to medicines or drugs, or an allergic reaction. In some cases, the cause may not be known. What are the signs or symptoms? The main symptom of this condition is pain in the joint during movement. Other symptoms include: Redness, swelling, or stiffness at a joint. Warmth coming from the joint. Fever. Overall feeling of illness. How is this diagnosed? This condition may be diagnosed with a physical exam and tests, including: Blood tests. Urine tests. Imaging tests, such as X-rays, an MRI, or a CT scan. Sometimes, fluid is removed from a joint for testing. How is this treated? This condition may be treated with: Treatment of the cause, if it is known. Rest. Raising (elevating) the joint. Applying cold or hot packs to the joint. Medicines to improve symptoms and reduce inflammation. Injections of a steroid, such as cortisone, into the joint to help reduce pain and inflammation. Depending on the cause of your arthritis, you may need to make lifestyle changes to reduce stress on your joint. Changes may include: Exercising more. Losing weight. Follow these instructions at home: Medicines Take over-the-counter and prescription medicines only as told by your health care provider. Do not take aspirin to relieve pain if your health care provider thinks that gout may be causing your pain. Activity Rest your joint if told by your health care provider. Rest is important when your disease is active and your joint feels painful, swollen, or stiff. Avoid activities that make the pain worse. Balance activity with rest. Exercise your joint  regularly with range-of-motion exercises as told by your health care provider. Try doing low-impact exercise, such as: Swimming. Water aerobics. Biking. Walking. Managing pain, stiffness, and swelling     If directed, put ice on the affected joint. To do this: Put ice in a plastic bag. Place a towel between your skin and the bag. Leave the ice on for 20 minutes, 2-3 times a day. Remove the ice if your skin turns bright red. This is very important. If you cannot feel pain, heat, or cold, you have a greater risk of damage to the area. If your joint is swollen, raise (elevate) it above the level of your heart if directed by your health care provider. If your joint feels stiff in the morning, try taking a warm shower. If directed, apply heat to the affected area as often as told by your health care provider. Use the heat source that your health care provider recommends, such as a moist heat pack or a heating pad. To apply heat: Place a towel between your skin and the heat source. Leave the heat on for 20-30 minutes. Remove the heat if your skin turns bright red. This is especially important if you are unable to feel pain, heat, or cold. You have a greater risk of getting burned. General instructions Maintain a healthy weight. Follow instructions from your health care provider for weight control. Do not use any products that contain nicotine or tobacco. These products include cigarettes, chewing tobacco, and vaping devices, such as e-cigarettes. If you need help quitting, ask your health care provider. Keep all follow-up visits. This is important. Where  to find more information [employer] of [employer]: www.niams.http://www.myers.net/ Contact a health care provider if: The pain gets worse. You have a fever. Get help right away if: You develop severe joint pain, swelling, or redness. Many joints become painful and swollen. You develop severe back pain. You develop severe weakness in your  leg. Summary Arthritis is a term that is commonly used to refer to joint pain or joint disease. There are more than 100 types of arthritis. The most common cause of this condition is wear and tear of a joint. Other causes include gout, inflammation or infection of the joint, sprains, or allergies. Symptoms of this condition include redness, swelling, or stiffness of the joint. Other symptoms include warmth, fever, or feeling ill. This condition is treated with rest, elevation, medicines, and applying cold or hot packs. Follow your health care provider's instructions about medicines, activity, exercises, and other home care treatments. This information is not intended to replace advice given to you by your health care provider. Make sure you discuss any questions you have with your health care provider. Document Revised: 03/27/2021 Document Reviewed: 03/27/2021 Elsevier Patient Education  2024 [employer].

## 2023-04-09 NOTE — Progress Notes (Signed)
Subjective:  Patient ID: Brad Randolph, male    DOB: Apr 28, 1976  Age: 47 y.o. MRN: [id number]  CC: Medical Management of Chronic Issues (Discuss arthritis)   HPI Brad Randolph is a 47 y.o. year old male patient of Dr. Delford Field with a history of CAD (status post DES x 2 to RCA), hyperlipidemia, hypertension, migraines, bipolar disorder (managed by [facility]), borderline personality disorder, nicotine dependence.  Interval History: Discussed the use of AI scribe software for clinical note transcription with the patient, who gave verbal consent to proceed.  He presents for a routine follow-up.  He completed DAPT and is currently on aspirin alone.  Seen by heart care in [facility] last year but currently followed by cardiology at [facility] medical: States he had a recent visit.  He has no chest pain, dyspnea or pedal edema.  He is doing well on his antihypertensive and his statin. He is also managed by psychiatry-[facility] for bipolar disorder.   The patient's main concern today is arthritis, which affects his knees, lower back, and possibly hands, as he has been experiencing 'locking up.' He has been evaluated by orthopedics for his knee symptoms, which were diagnosed as arthritis. However, he declined injections due to cost. The arthritis symptoms have been present for a while and are worse in cold weather.        Past Medical History:  Diagnosis Date   Anxiety    Asthma    chilldhood   Bipolar disorder (HCC)    Borderline personality disorder (HCC)    CAD (coronary artery disease)    a. s/p DESx2 to RCA in 12/2015 b. low-risk NST in 03/2018   Depression    Heart attack (HCC)    Hyperlipidemia    Hypertension    Migraine    Seizures (HCC) 2018   last sz 12/2017    Past Surgical History:  Procedure Laterality Date   CORONARY ANGIOPLASTY WITH STENT PLACEMENT      Family History  Problem Relation Age of Onset   Diabetes Mother    Hypertension Mother    Diabetes Sister    Cancer Sister     Diabetes Maternal Aunt    Cancer Paternal Aunt    Diabetes Maternal Grandmother    Hypertension Maternal Grandmother    Hypertension Maternal Grandfather    Cancer Maternal Grandfather        lung cancer   Hypertension Paternal Grandmother    Heart disease Father    Diabetes Father     Social History   Socioeconomic History   Marital status: Married    Spouse name: Not on file   Number of children: 2   Years of education: 11   Highest education level: Not on file  Occupational History    Comment: NA  Tobacco Use   Smoking status: Every Day    Current packs/day: 0.25    Average packs/day: 0.3 packs/day for 33.0 years (8.3 ttl pk-yrs)    Types: Cigarettes   Smokeless tobacco: Never  Vaping Use   Vaping status: Never Used  Substance and Sexual Activity   Alcohol use: Not Currently   Drug use: Not Currently    Types: Marijuana, Methamphetamines   Sexual activity: Not Currently  Other Topics Concern   Not on file  Social History Narrative   Lives with sister Lori's daughter, his neice   Tea, some Coke   Social Determinants of Health   Financial Resource Strain: High Risk (04/09/2023)   Overall Financial Resource Strain (CARDIA)  Difficulty of Paying Living Expenses: Hard  Food Insecurity: Food Insecurity Present (04/09/2023)   Hunger Vital Sign    Worried About Running Out of Food in the Last Year: Sometimes true    Ran Out of Food in the Last Year: Sometimes true  Transportation Needs: No Transportation Needs (04/09/2023)   PRAPARE - Administrator, Civil Service (Medical): No    Lack of Transportation (Non-Medical): No  Physical Activity: Insufficiently Active (04/09/2023)   Exercise Vital Sign    Days of Exercise per Week: 5 days    Minutes of Exercise per Session: 20 min  Stress: Stress Concern Present (04/09/2023)   [employer] of Occupational Health - Occupational Stress Questionnaire    Feeling of Stress : To some extent  Social  Connections: Moderately Isolated (04/09/2023)   Social Connection and Isolation Panel [NHANES]    Frequency of Communication with Friends and Family: More than three times a week    Frequency of Social Gatherings with Friends and Family: Never    Attends Religious Services: Never    Database administrator or Organizations: No    Attends Engineer, structural: Never    Marital Status: Married    Allergies  Allergen Reactions   Soap Rash    Dial soap peels skin  Dial soap peels skin  Dial soap peels skin     Tramadol Other (See Comments)    "mean" and aggressive per pt "mean" and aggressive per pt "mean" and aggressive per pt aggression     Mushroom Extract Complex Diarrhea and Nausea And Vomiting   Other Hives    Dial Soap. - breaks out and blisters   Trazodone And Nefazodone Other (See Comments)    Violent    Outpatient Medications Prior to Visit  Medication Sig Dispense Refill   albuterol (PROVENTIL HFA) 108 (90 Base) MCG/ACT inhaler INHALE 2 PUFFS BY MOUTH EVERY 6 HOURS AS NEEDED FOR COUGHING, WHEEZING, OR SHORTNESS OF BREATH 18 g 1   aspirin EC (ASPIRIN LOW DOSE) 81 MG tablet Take 1 tablet (81 mg total) by mouth daily. Swallow whole. 100 tablet 1   atorvastatin (LIPITOR) 40 MG tablet Take 1 tablet (40 mg total) by mouth daily. 90 tablet 3   diclofenac Sodium (VOLTAREN) 1 % GEL APPLY 4 GM 4 TIMES DAILY. TO BOTH KNEES 100 g 1   hydrOXYzine (VISTARIL) 25 MG capsule Take 25 mg by mouth daily as needed.     Paliperidone ER (INVEGA SUSTENNA) injection Inject into the muscle every 30 (thirty) days.     predniSONE (DELTASONE) 10 MG tablet TAKE 1 TABLET (10 MG TOTAL) BY MOUTH DAILY WITH BREAKFAST. 30 tablet 0   sertraline (ZOLOFT) 25 MG tablet Take 25 mg by mouth daily.     amLODipine (NORVASC) 5 MG tablet Take 1 tablet (5 mg total) by mouth daily. 90 tablet 1   isosorbide mononitrate (IMDUR) 30 MG 24 hr tablet Take 1 tablet (30 mg total) by mouth daily. 90 tablet 1    metoprolol tartrate (LOPRESSOR) 25 MG tablet Take 1 tablet (25 mg total) by mouth 2 (two) times daily. 180 tablet 1   No facility-administered medications prior to visit.     ROS Review of Systems  Constitutional:  Negative for activity change and appetite change.  HENT:  Negative for sinus pressure and sore throat.   Respiratory:  Negative for chest tightness, shortness of breath and wheezing.   Cardiovascular:  Negative for chest pain and palpitations.  Gastrointestinal:  Negative for abdominal distention, abdominal pain and constipation.  Genitourinary: Negative.   Musculoskeletal:        See HPI  Psychiatric/Behavioral:  Negative for behavioral problems and dysphoric mood.     Objective:  BP 113/76   Pulse 73   Ht 5\' 7"  (1.702 m)   Wt 207 lb (93.9 kg)   SpO2 98%   BMI 32.42 kg/m      04/09/2023   10:50 AM 12/04/2022    1:41 PM 03/26/2022    3:57 PM  BP/Weight  Systolic BP 113 105 112  Diastolic BP 76 70 70  Wt. (Lbs) 207    BMI 32.42 kg/m2        Physical Exam Constitutional:      Appearance: He is well-developed.  Cardiovascular:     Rate and Rhythm: Normal rate.     Heart sounds: Normal heart sounds. No murmur heard. Pulmonary:     Effort: Pulmonary effort is normal.     Breath sounds: Normal breath sounds. No wheezing or rales.  Chest:     Chest wall: No tenderness.  Abdominal:     General: Bowel sounds are normal. There is no distension.     Palpations: Abdomen is soft. There is no mass.     Tenderness: There is no abdominal tenderness.  Musculoskeletal:        General: Normal range of motion.     Right lower leg: No edema.     Left lower leg: No edema.     Comments: Normal appearance of both hands; patient is able to make a fist bilaterally Normal range of motion of both knees.  No tenderness on range of motion.  Neurological:     Mental Status: He is alert and oriented to person, place, and time.  Psychiatric:        Mood and Affect: Mood  normal.        Latest Ref Rng & Units 08/13/2022    2:22 PM 09/25/2021    9:51 AM 06/29/2020    3:00 PM  CMP  Glucose 70 - 99 mg/dL 098  119  147   BUN 6 - 24 mg/dL 11  10  9    Creatinine 0.76 - 1.27 mg/dL 8.29  5.62  1.30   Sodium 134 - 144 mmol/L 141  138  135   Potassium 3.5 - 5.2 mmol/L 4.3  4.3  3.4   Chloride 96 - 106 mmol/L 104  104  104   CO2 20 - 29 mmol/L 22  22  21    Calcium 8.7 - 10.2 mg/dL 9.5  9.1  8.9   Total Protein 6.0 - 8.5 g/dL  6.8    Total Bilirubin 0.0 - 1.2 mg/dL  0.8    Alkaline Phos 44 - 121 IU/L  61    AST 0 - 40 IU/L  21    ALT 0 - 44 IU/L  18      Lipid Panel     Component Value Date/Time   CHOL 209 (H) 08/13/2022 1422   TRIG 239 (H) 08/13/2022 1422   HDL 34 (L) 08/13/2022 1422   CHOLHDL 6.1 (H) 08/13/2022 1422   CHOLHDL 3.5 05/02/2020 1311   VLDL 15 05/02/2020 1311   LDLCALC 132 (H) 08/13/2022 1422    CBC    Component Value Date/Time   WBC 7.7 09/25/2021 0951   WBC 11.9 (H) 06/29/2020 1500   RBC 4.64 09/25/2021 0951   RBC 4.26 06/29/2020 1500  HGB 14.1 09/25/2021 0951   HCT 42.2 09/25/2021 0951   PLT 182 09/25/2021 0951   MCV 91 09/25/2021 0951   MCH 30.4 09/25/2021 0951   MCH 31.5 06/29/2020 1500   MCHC 33.4 09/25/2021 0951   MCHC 34.2 06/29/2020 1500   RDW 12.2 09/25/2021 0951   LYMPHSABS 2.5 09/25/2021 0951   MONOABS 0.7 02/10/2020 1758   EOSABS 0.2 09/25/2021 0951   BASOSABS 0.0 09/25/2021 0951    Lab Results  Component Value Date   HGBA1C 5.6 09/25/2021    Assessment & Plan:      Coronary Artery Disease Stable with no reported chest pain, shortness of breath, or ankle swelling.  Completed DAPT Patient states he recently had an echocardiogram at [facility] cardiology (will have him sign medical release). Currently on aspirin, amlodipine, isosorbide, metoprolol, and atorvastatin. -Continue current medications. -Refill medications for 6 months.  Hyperlipidemia LDL above goal of less than 70 -Order lipid  panel to assess cholesterol control and will adjust statin dose accordingly -Low-cholesterol diet  Hypertension Controlled -Continue antihypertensive -Counseled on blood pressure goal of less than 130/80, low-sodium, DASH diet, medication compliance, 150 minutes of moderate intensity exercise per week. Discussed medication compliance, adverse effects.  Arthritis Reports arthritis in knees, lower back, and hands with worsening symptoms in cold weather. Unable to afford knee injections. -Start meloxicam for inflammation. -Encourage water therapy and exercise. -Suggest over-the-counter turmeric for additional anti-inflammatory benefits.  Bipolar Disorder Managed by Dr. at [facility]. -Continue current management.  Tobacco Use Reports smoking a quarter pack of cigarettes per day. No current interest in quitting aids. -Encourage continued efforts to reduce and eventually quit smoking.  Follow-up in 6 months or sooner if any issues arise.          Meds ordered this encounter  Medications   amLODipine (NORVASC) 5 MG tablet    Sig: Take 1 tablet (5 mg total) by mouth daily.    Dispense:  90 tablet    Refill:  1   isosorbide mononitrate (IMDUR) 30 MG 24 hr tablet    Sig: Take 1 tablet (30 mg total) by mouth daily.    Dispense:  90 tablet    Refill:  1   metoprolol tartrate (LOPRESSOR) 25 MG tablet    Sig: Take 1 tablet (25 mg total) by mouth 2 (two) times daily.    Dispense:  180 tablet    Refill:  1   meloxicam (MOBIC) 7.5 MG tablet    Sig: Take 1 tablet (7.5 mg total) by mouth daily.    Dispense:  30 tablet    Refill:  1    Follow-up: Return in about 6 months (around 10/08/2023) for Chronic medical conditions.       Hoy Register, MD, FAAFP. [facility] [place], [place] [phone]   04/09/2023, 11:15 AM

## 2023-04-10 LAB — CMP14+EGFR
ALT: 25 [IU]/L (ref 0–44)
AST: 24 [IU]/L (ref 0–40)
Albumin: 4.1 g/dL (ref 4.1–5.1)
Alkaline Phosphatase: 70 [IU]/L (ref 44–121)
BUN/Creatinine Ratio: 6 — ABNORMAL LOW (ref 9–20)
BUN: 6 mg/dL (ref 6–24)
Bilirubin Total: 1 mg/dL (ref 0.0–1.2)
CO2: 23 mmol/L (ref 20–29)
Calcium: 9.1 mg/dL (ref 8.7–10.2)
Chloride: 103 mmol/L (ref 96–106)
Creatinine, Ser: 0.99 mg/dL (ref 0.76–1.27)
Globulin, Total: 2.5 g/dL (ref 1.5–4.5)
Glucose: 150 mg/dL — ABNORMAL HIGH (ref 70–99)
Potassium: 3.9 mmol/L (ref 3.5–5.2)
Sodium: 139 mmol/L (ref 134–144)
Total Protein: 6.6 g/dL (ref 6.0–8.5)
eGFR: 94 mL/min/{1.73_m2} (ref >59)

## 2023-04-10 LAB — LP+NON-HDL CHOLESTEROL
Cholesterol, Total: 102 mg/dL (ref 100–199)
HDL: 28 mg/dL — ABNORMAL LOW (ref >39)
LDL Chol Calc (NIH): 56 mg/dL (ref 0–99)
Total Non-HDL-Chol (LDL+VLDL): 74 mg/dL (ref 0–129)
Triglycerides: 90 mg/dL (ref 0–149)
VLDL Cholesterol Cal: 18 mg/dL (ref 5–40)

## 2023-06-07 ENCOUNTER — Other Ambulatory Visit: Payer: Self-pay | Admitting: Family Medicine

## 2023-06-07 DIAGNOSIS — M1990 Unspecified osteoarthritis, unspecified site: Secondary | ICD-10-CM

## 2023-06-08 ENCOUNTER — Other Ambulatory Visit: Payer: Self-pay | Admitting: Physician Assistant

## 2023-06-08 DIAGNOSIS — I10 Essential (primary) hypertension: Secondary | ICD-10-CM

## 2023-06-09 NOTE — Telephone Encounter (Signed)
Requested Prescriptions  Pending Prescriptions Disp Refills   meloxicam (MOBIC) 7.5 MG tablet [Pharmacy Med Name: MELOXICAM 7.5MG  TABLETS] 30 tablet 1    Sig: TAKE 1 TABLET(7.5 MG) BY MOUTH DAILY     Analgesics:  COX2 Inhibitors Failed - 06/07/2023  3:42 AM      Failed - Manual Review: Labs are only required if the patient has taken medication for more than 8 weeks.      Failed - HGB in normal range and within 360 days    Hemoglobin  Date Value Ref Range Status  09/25/2021 14.1 13.0 - 17.7 g/dL Final         Failed - HCT in normal range and within 360 days    Hematocrit  Date Value Ref Range Status  09/25/2021 42.2 37.5 - 51.0 % Final         Passed - Cr in normal range and within 360 days    Creatinine, Ser  Date Value Ref Range Status  04/09/2023 0.99 0.76 - 1.27 mg/dL Final         Passed - AST in normal range and within 360 days    AST  Date Value Ref Range Status  04/09/2023 24 0 - 40 IU/L Final         Passed - ALT in normal range and within 360 days    ALT  Date Value Ref Range Status  04/09/2023 25 0 - 44 IU/L Final         Passed - eGFR is 30 or above and within 360 days    GFR calc Af Amer  Date Value Ref Range Status  02/10/2020 >60 >60 mL/min Final   GFR, Estimated  Date Value Ref Range Status  06/29/2020 >60 >60 mL/min Final    Comment:    (NOTE) Calculated using the CKD-EPI Creatinine Equation (2021)    eGFR  Date Value Ref Range Status  04/09/2023 94 >59 mL/min/1.73 Final         Passed - Patient is not pregnant      Passed - Valid encounter within last 12 months    Recent Outpatient Visits           2 months ago Bipolar affective disorder, current episode depressed, current episode severity unspecified (HCC)   [facility] [place]. [facility] Hoy Register, MD   6 months ago Essential hypertension   [facility] [place]. [facility] [place], Marzella Schlein,  [place]   1 year ago Lumbosacral radiculopathy   [facility] Of [place]. [facility] Storm Frisk, MD   1 year ago Essential hypertension   [facility] - [facility] Of [place]. [facility] Storm Frisk, MD

## 2023-06-10 NOTE — Telephone Encounter (Signed)
Rx 04/09/23 #90 1RF- should be on file Requested Prescriptions  Pending Prescriptions Disp Refills   amLODipine (NORVASC) 5 MG tablet [Pharmacy Med Name: AMLODIPINE BESYLATE 5MG  TABLETS] 90 tablet 1    Sig: TAKE 1 TABLET(5 MG) BY MOUTH DAILY     Cardiovascular: Calcium Channel Blockers 2 Passed - 06/08/2023  3:38 AM      Passed - Last BP in normal range    BP Readings from Last 1 Encounters:  04/09/23 113/76         Passed - Last Heart Rate in normal range    Pulse Readings from Last 1 Encounters:  04/09/23 73         Passed - Valid encounter within last 6 months    Recent Outpatient Visits           2 months ago Bipolar affective disorder, current episode depressed, current episode severity unspecified (HCC)   [place] [facility] [place]. [facility] Hoy Register, [place]   6 months ago Essential hypertension   [place] [facility] [place]. [facility], Marzella Schlein, [place]   1 year ago Lumbosacral radiculopathy   [place] [facility] [place]. [facility] Storm Frisk, [place]   1 year ago Essential hypertension   [place] [facility] [place]. [facility] Storm Frisk, MD               metoprolol tartrate (LOPRESSOR) 25 MG tablet [Pharmacy Med Name: METOPROLOL TARTRATE 25MG  TABLETS] 180 tablet 1    Sig: TAKE 1 TABLET(25 MG) BY MOUTH TWICE DAILY     Cardiovascular:  Beta Blockers Passed - 06/08/2023  3:38 AM      Passed - Last BP in normal range    BP Readings from Last 1 Encounters:  04/09/23 113/76         Passed - Last Heart Rate in normal range    Pulse Readings from Last 1 Encounters:  04/09/23 73         Passed - Valid encounter within last 6 months    Recent Outpatient Visits           2 months ago Bipolar affective disorder, current episode depressed, current episode severity unspecified (HCC)   [place] [facility] [place]. [facility] Hoy Register, MD   6 months ago Essential hypertension   [place] [facility] [place]. [facility], Marzella Schlein, [place]   1 year ago Lumbosacral radiculopathy   [place] [facility] [place] [facility] [place]. [facility] Storm Frisk, [place]   1 year ago Essential hypertension    [facility] [place]. [facility] Storm Frisk, MD

## 2023-08-14 ENCOUNTER — Other Ambulatory Visit: Payer: Self-pay | Admitting: Family Medicine

## 2023-08-14 DIAGNOSIS — M1990 Unspecified osteoarthritis, unspecified site: Secondary | ICD-10-CM

## 2023-08-14 NOTE — Telephone Encounter (Signed)
Requested medications are due for refill today.  yes  Requested medications are on the active medications list.  yes  Last refill. 06/09/2023 #30 1 rf  Future visit scheduled.   no  Notes to clinic.  Labs are expired.    Requested Prescriptions  Pending Prescriptions Disp Refills   meloxicam (MOBIC) 7.5 MG tablet [Pharmacy Med Name: MELOXICAM 7.5MG  TABLETS] 30 tablet 1    Sig: TAKE 1 TABLET(7.5 MG) BY MOUTH DAILY     Analgesics:  COX2 Inhibitors Failed - 08/14/2023  1:28 PM      Failed - Manual Review: Labs are only required if the patient has taken medication for more than 8 weeks.      Failed - HGB in normal range and within 360 days    Hemoglobin  Date Value Ref Range Status  09/25/2021 14.1 13.0 - 17.7 g/dL Final         Failed - HCT in normal range and within 360 days    Hematocrit  Date Value Ref Range Status  09/25/2021 42.2 37.5 - 51.0 % Final         Passed - Cr in normal range and within 360 days    Creatinine, Ser  Date Value Ref Range Status  04/09/2023 0.99 0.76 - 1.27 mg/dL Final         Passed - AST in normal range and within 360 days    AST  Date Value Ref Range Status  04/09/2023 24 0 - 40 IU/L Final         Passed - ALT in normal range and within 360 days    ALT  Date Value Ref Range Status  04/09/2023 25 0 - 44 IU/L Final         Passed - eGFR is 30 or above and within 360 days    GFR calc Af Amer  Date Value Ref Range Status  02/10/2020 >60 >60 mL/min Final   GFR, Estimated  Date Value Ref Range Status  06/29/2020 >60 >60 mL/min Final    Comment:    (NOTE) Calculated using the CKD-EPI Creatinine Equation (2021)    eGFR  Date Value Ref Range Status  04/09/2023 94 >59 mL/min/1.73 Final         Passed - Patient is not pregnant      Passed - Valid encounter within last 12 months    Recent Outpatient Visits           4 months ago Bipolar affective disorder, current episode depressed, current episode severity unspecified (HCC)    [place] [facility] [place]. [facility] Hoy Register, [place]   8 months ago Essential hypertension   [place] [facility] [facility] [place]. [facility] [place], Marzella Schlein, [place]   1 year ago Lumbosacral radiculopathy   [place] [facility] [facility] [place]. [facility] Storm Frisk, [place]   1 year ago Essential hypertension   [place] [facility] [place] [facility] Of . [facility] Storm Frisk, MD

## 2023-08-18 ENCOUNTER — Other Ambulatory Visit: Payer: Self-pay | Admitting: Critical Care Medicine

## 2023-08-18 DIAGNOSIS — E782 Mixed hyperlipidemia: Secondary | ICD-10-CM

## 2023-10-01 IMAGING — CR DG KNEE COMPLETE 4+V*L*
4 series · 4 of 4 positions shown · non-contrast
Comparison: None Available.

CLINICAL DATA: Chronic lower back pain.  Bilateral knee pain.

EXAM:
LEFT KNEE - COMPLETE 4+ VIEW

[w knee ap left (1 of 2)]
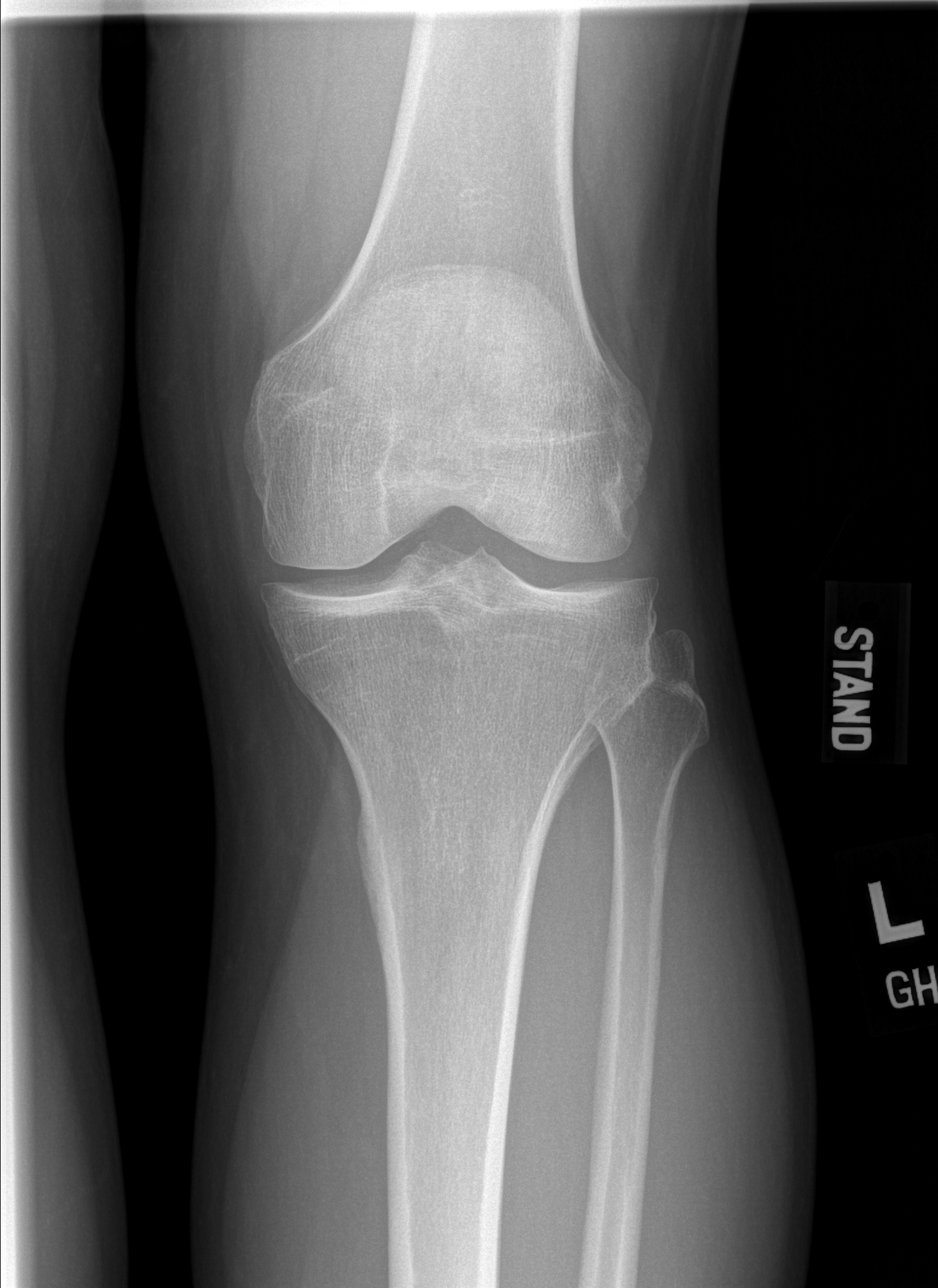

[w knee lat. left]
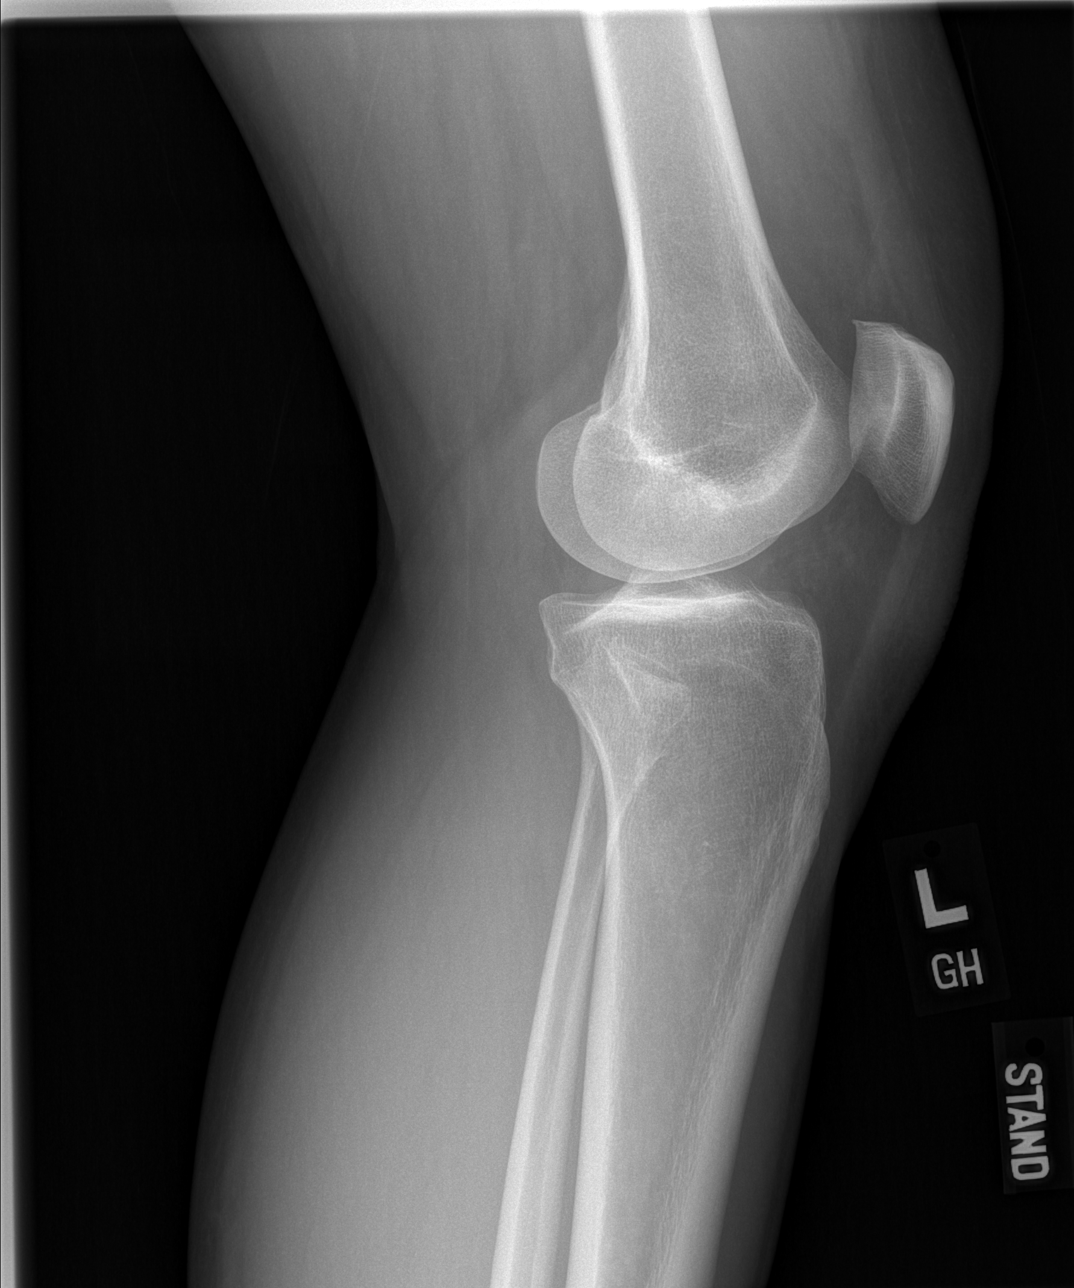

[w knee ap left (2 of 2)]
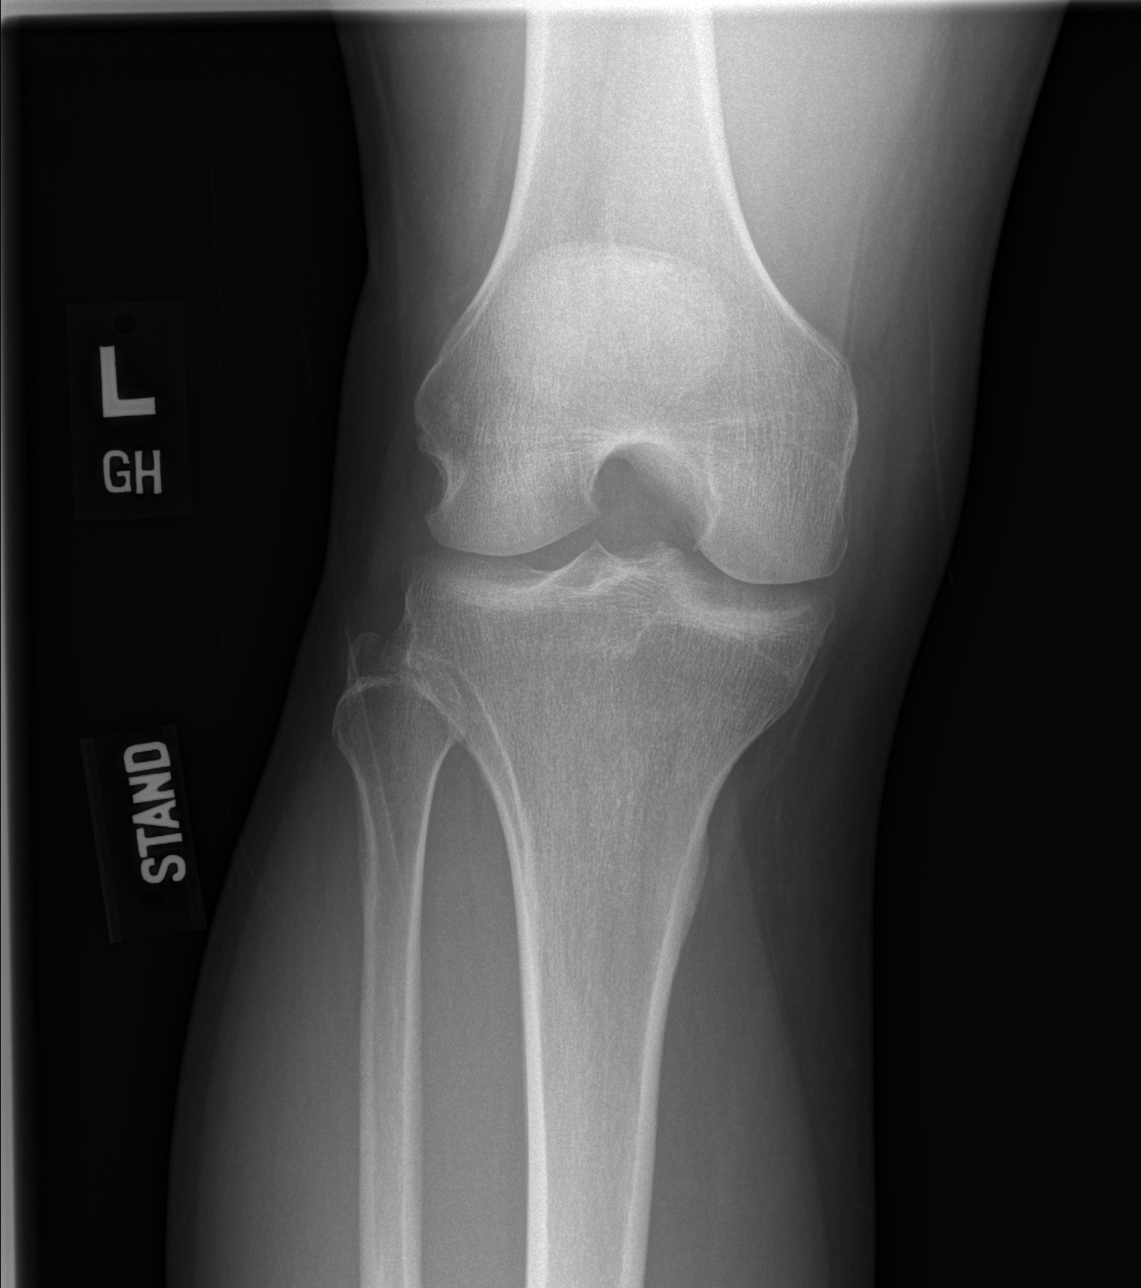

[w knee ap left *]
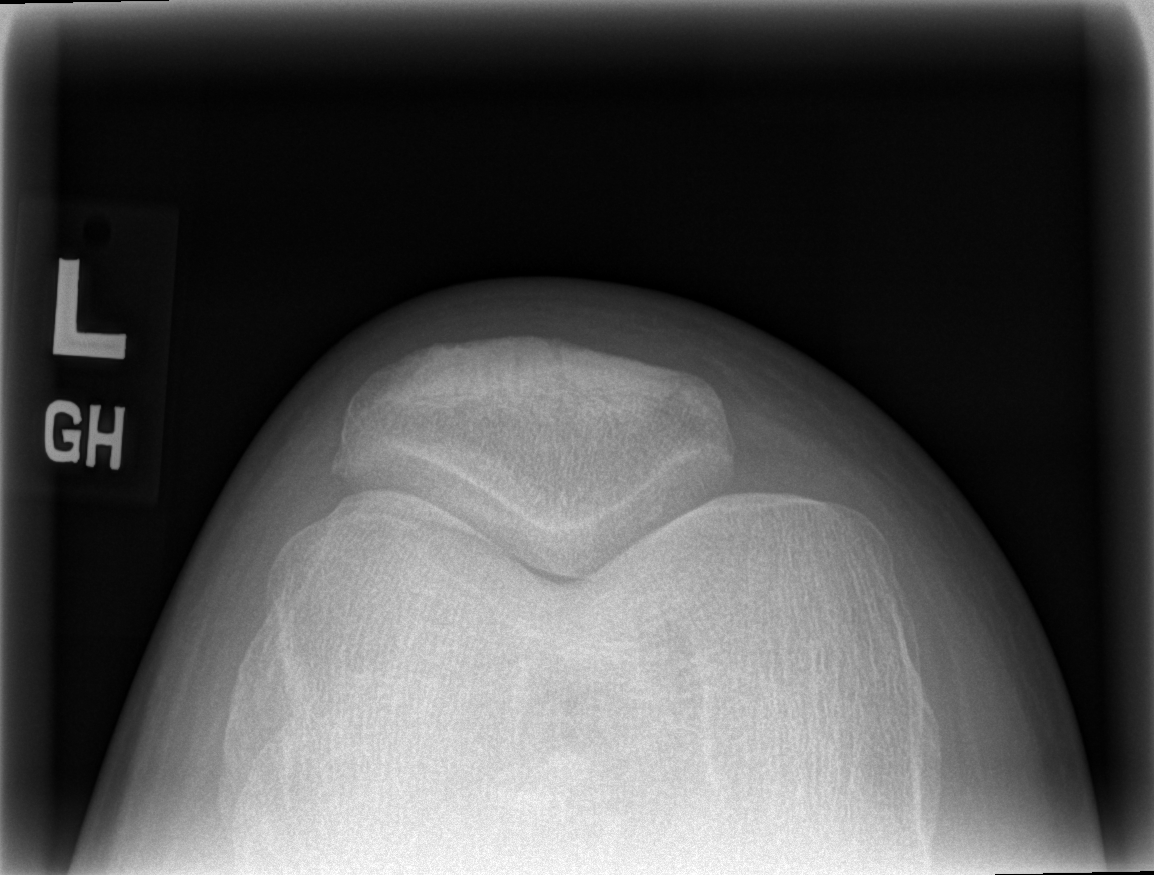

[4 of 4 positions shown; findings below may reference images not displayed]

FINDINGS: Minimal medial compartment joint space narrowing. Mild superior
patellar degenerative osteophytes. No joint effusion. No acute
fracture or dislocation.
IMPRESSION: Minimal medial compartment joint space narrowing.

## 2023-10-01 IMAGING — CR DG KNEE COMPLETE 4+V*R*
4 series · 4 of 4 positions shown · non-contrast
Comparison: None Available.

CLINICAL DATA: Chronic lower back pain with bilateral knee pain.

EXAM:
RIGHT KNEE - COMPLETE 4+ VIEW

[w knee ap right (1 of 3)]
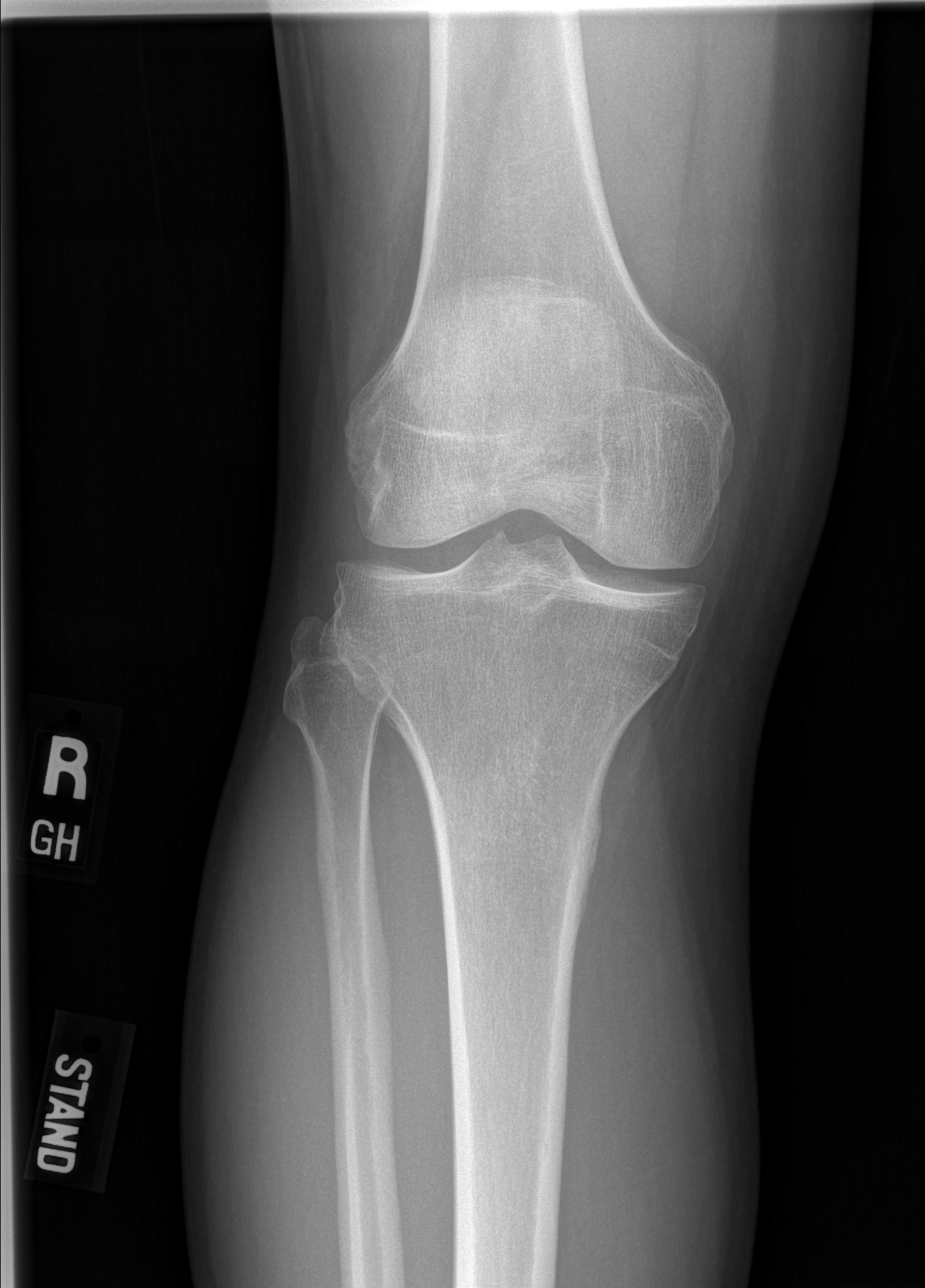

[w knee lat. right]
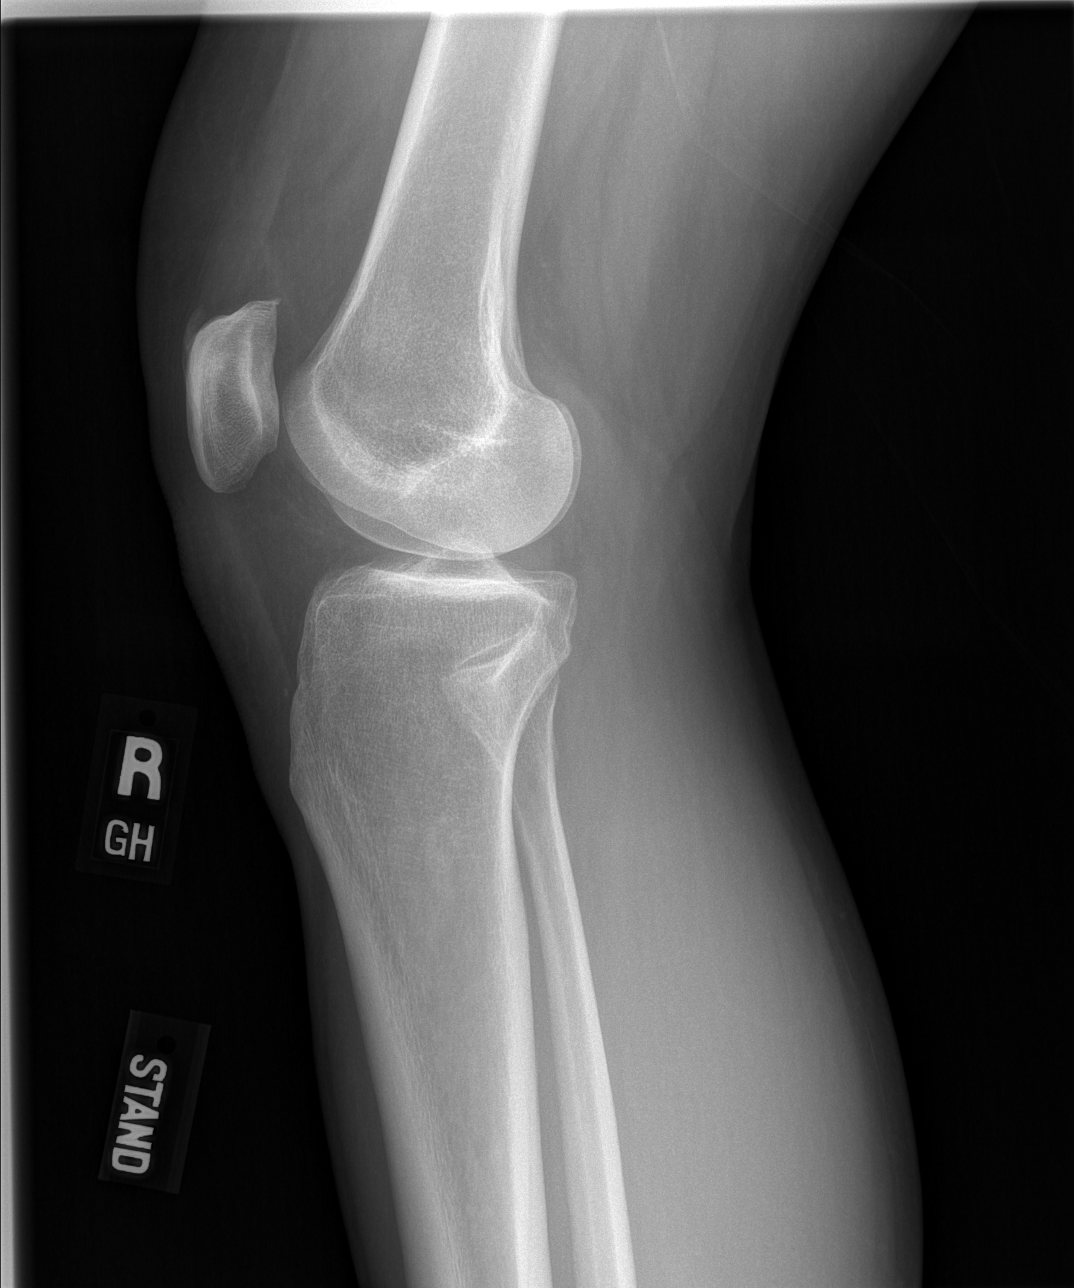

[w knee ap right (2 of 3)]
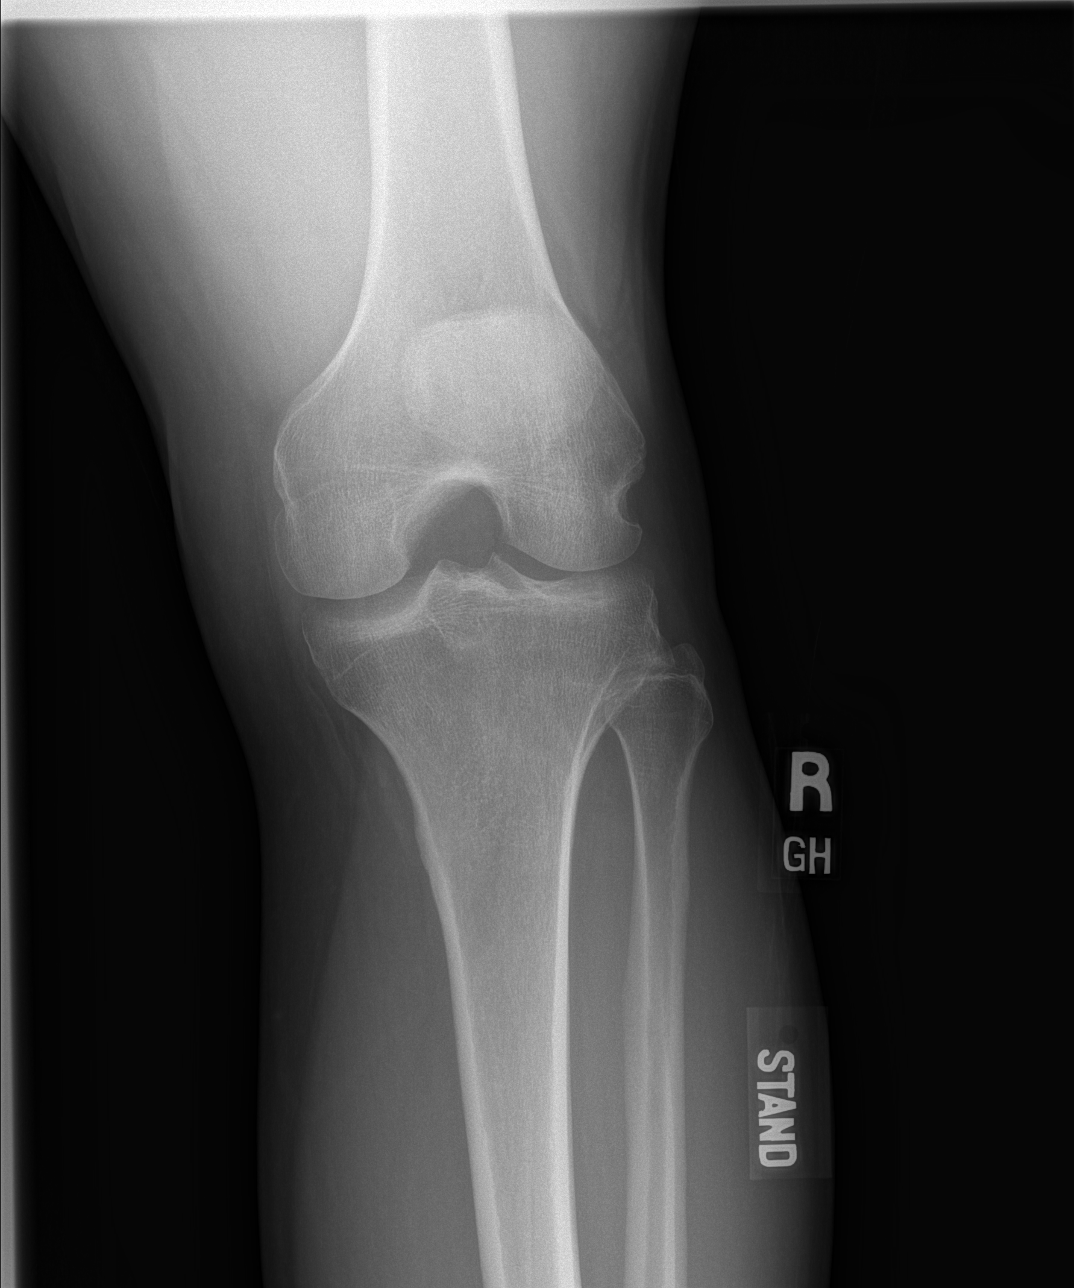

[w knee ap right (3 of 3)]
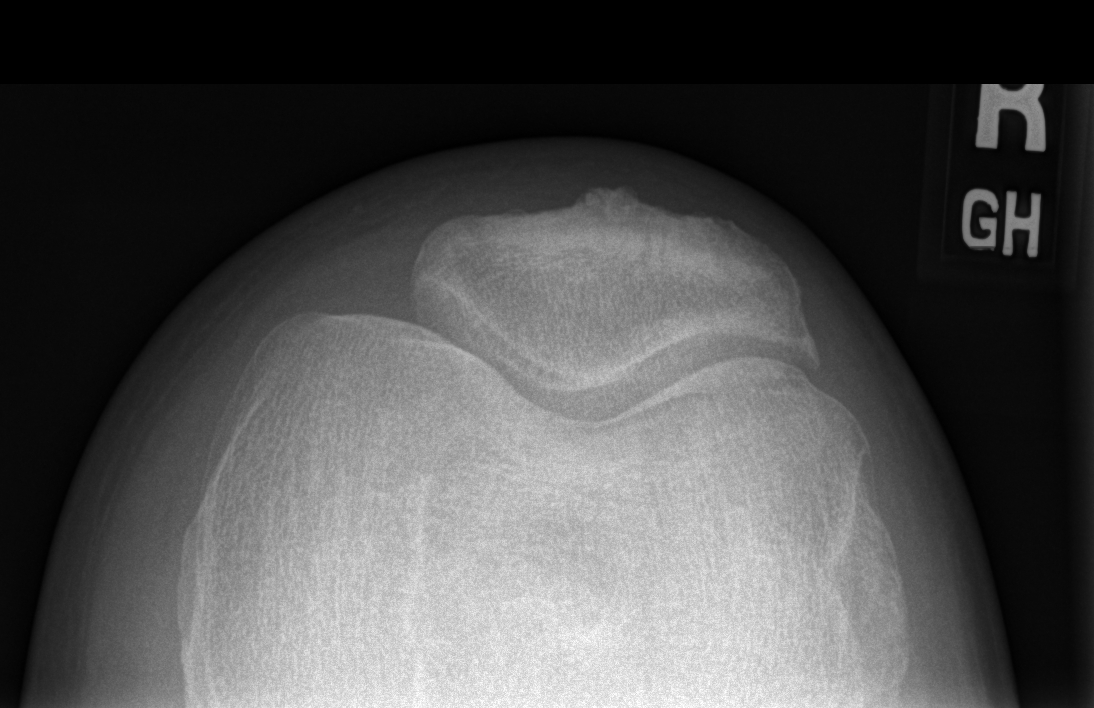

[4 of 4 positions shown; findings below may reference images not displayed]

FINDINGS: Minimal medial compartment joint space narrowing. Minimal superior
and mild lateral patellar degenerative osteophytes. Minimal chronic
enthesopathic change at the quadriceps insertion on the patella. No
joint effusion. No acute fracture or dislocation.
IMPRESSION: Minimal medial compartment joint space narrowing.

Minimal superior and mild lateral patellar degenerative
osteophytosis.

## 2023-10-01 IMAGING — CR DG LUMBAR SPINE COMPLETE 4+V
5 series · 5 of 5 positions shown · non-contrast
Comparison: Lumbar spine x-ray 08/30/2019

CLINICAL DATA: Low back pain

EXAM:
LUMBAR SPINE - COMPLETE 4+ VIEW

[t l-spine a.p.]
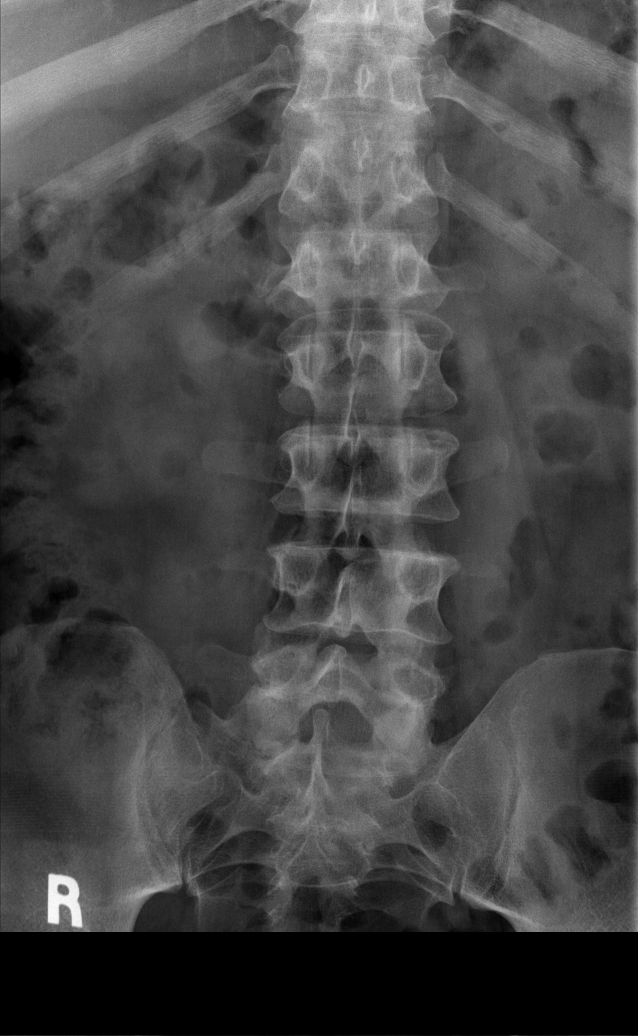

[t l-spine oblique exposure (1 of 2)]
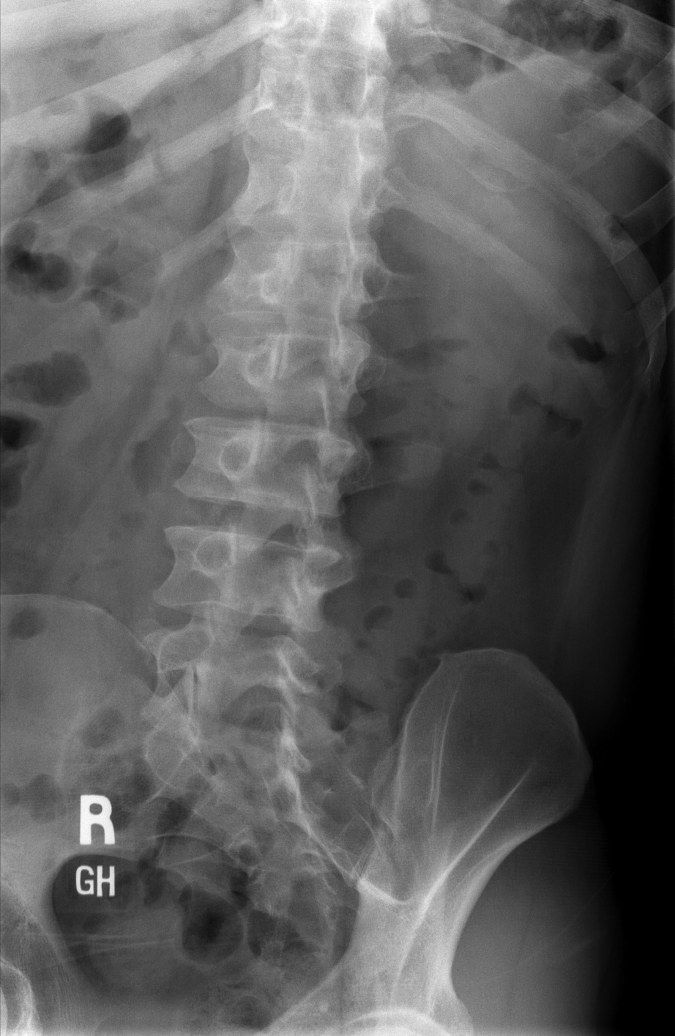

[t l-spine oblique exposure (2 of 2)]
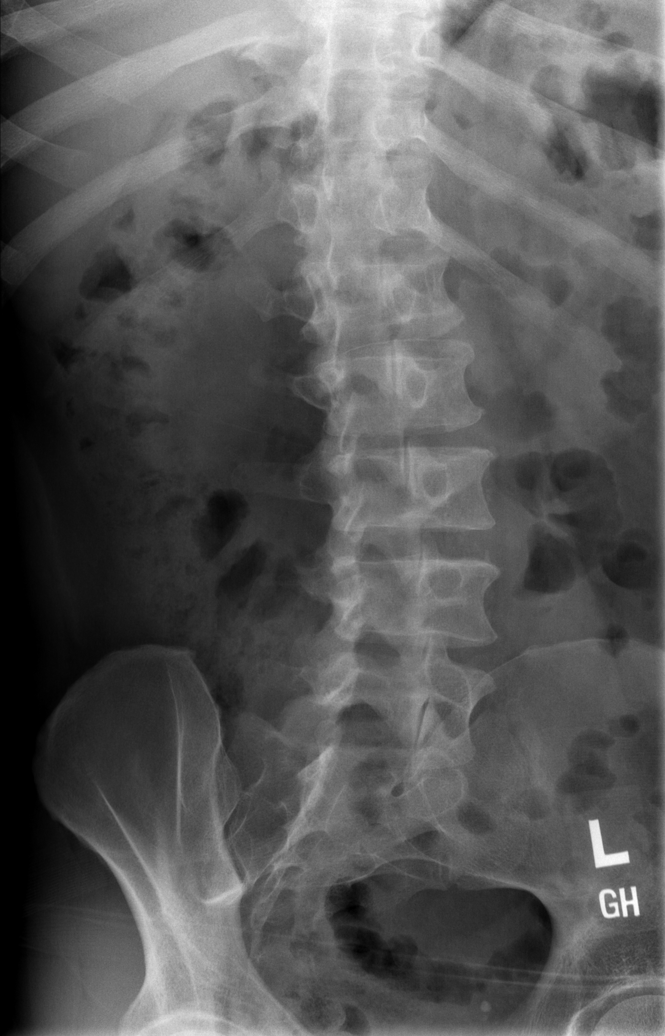

[t l-spine lat]
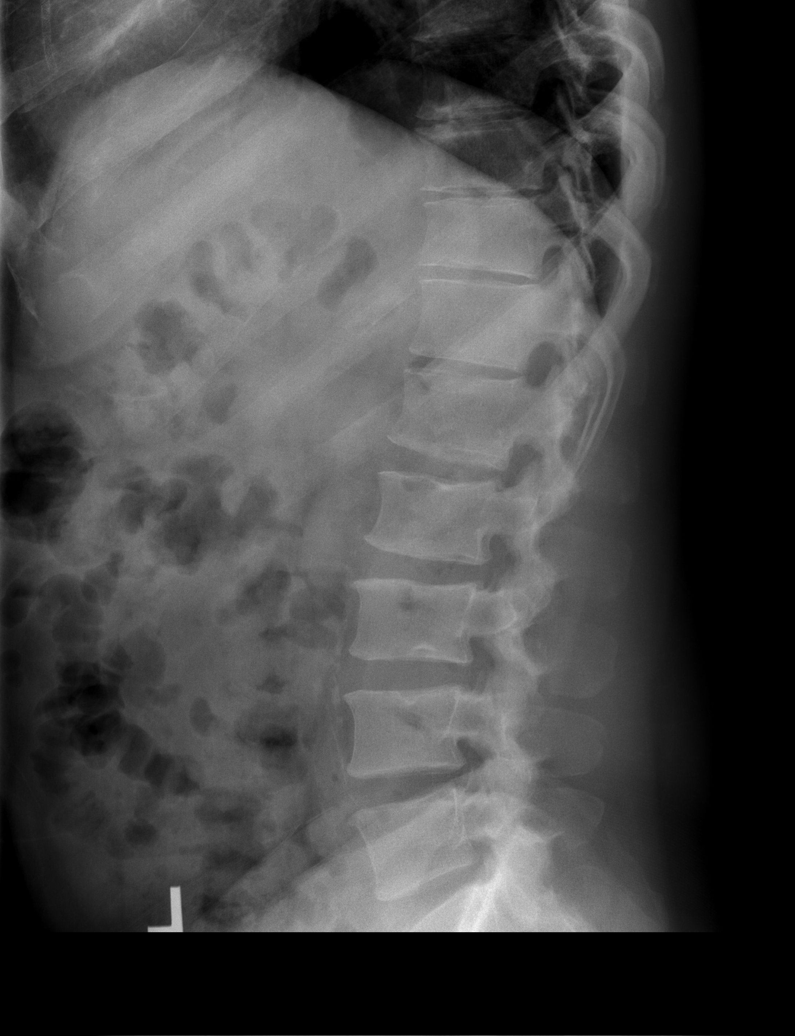

[t l-spine l5-s1 spot]
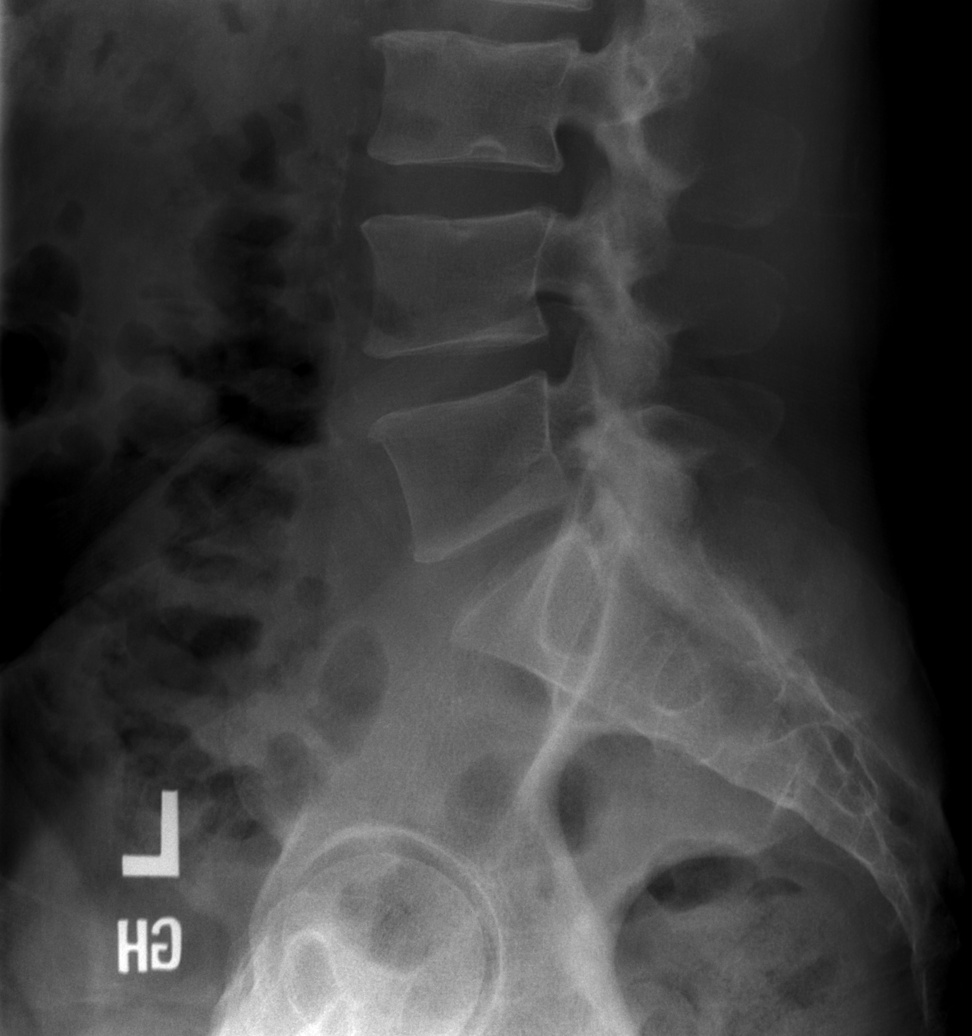

[5 of 5 positions shown; findings below may reference images not displayed]

FINDINGS: No acute fracture or malalignment identified. Mild intervertebral
disc space narrowing and facet arthropathy in the lower lumbar
spine. No spondylolysis identified.
IMPRESSION: No acute osseous abnormality identified.

## 2023-12-24 ENCOUNTER — Other Ambulatory Visit: Payer: Self-pay | Admitting: Family Medicine

## 2023-12-24 DIAGNOSIS — I252 Old myocardial infarction: Secondary | ICD-10-CM

## 2024-01-21 ENCOUNTER — Other Ambulatory Visit: Payer: Self-pay | Admitting: Family Medicine

## 2024-01-21 DIAGNOSIS — I252 Old myocardial infarction: Secondary | ICD-10-CM

## 2024-01-22 ENCOUNTER — Other Ambulatory Visit: Payer: Self-pay | Admitting: Family Medicine

## 2024-01-22 DIAGNOSIS — I10 Essential (primary) hypertension: Secondary | ICD-10-CM

## 2024-07-15 ENCOUNTER — Other Ambulatory Visit: Payer: Self-pay | Admitting: Physician Assistant
